# Patient Record
Sex: Female | Born: 2000 | Race: Black or African American | Hispanic: No | Marital: Single | Smoking: Never smoker
Health system: Southern US, Community
[De-identification: ages and names within clinical notes are randomized; demographics above are authoritative.]

## PROBLEM LIST (undated history)

## (undated) DIAGNOSIS — R011 Cardiac murmur, unspecified: Secondary | ICD-10-CM

## (undated) HISTORY — DX: Cardiac murmur, unspecified: R01.1

## (undated) HISTORY — PX: KNEE SURGERY: SHX244

---
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
## 2020-03-01 IMAGING — DX DG KNEE COMPLETE 4+V*L*
4 series · 4 of 4 positions shown · non-contrast
Comparison: None.

CLINICAL DATA: Knee injury playing basketball today. Left knee pain
and swelling. Initial encounter.

EXAM:
LEFT KNEE - COMPLETE 4+ VIEW

[knee ap (1 of 3)]
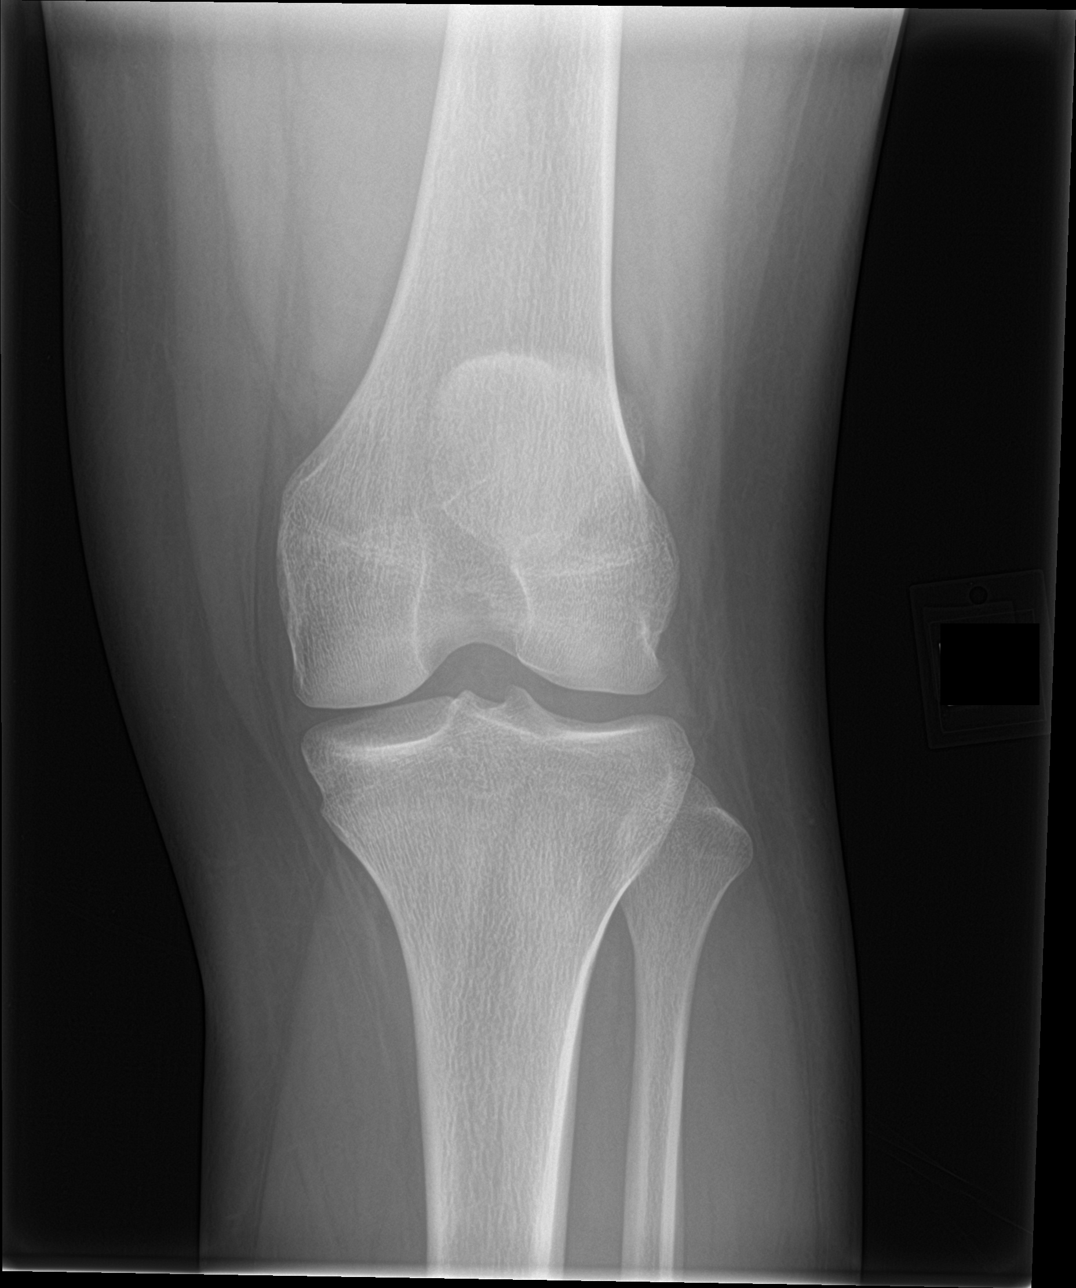

[knee lat]
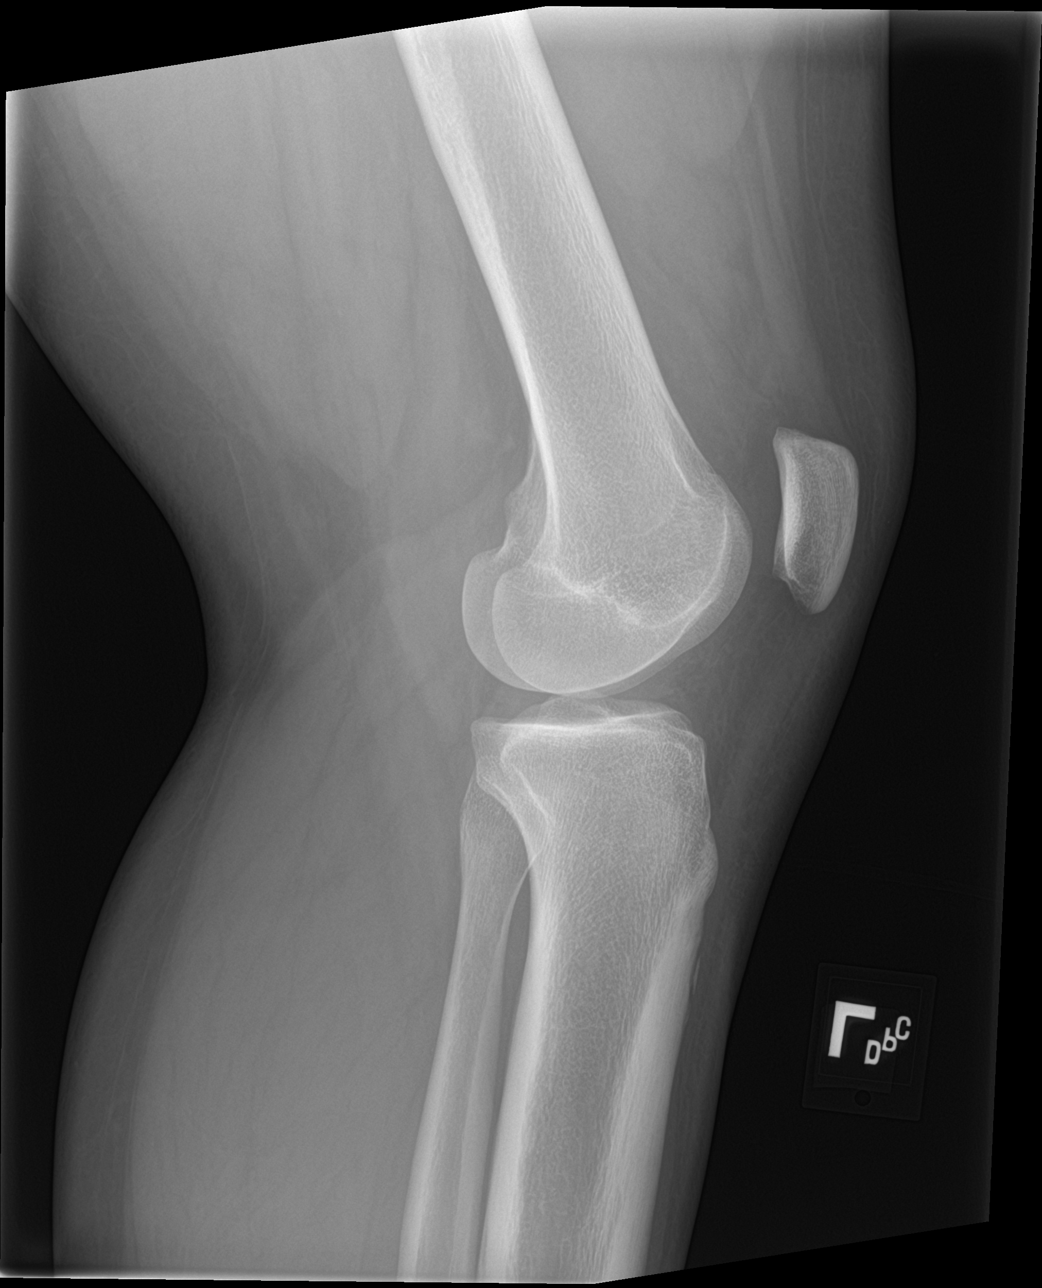

[knee ap (2 of 3)]
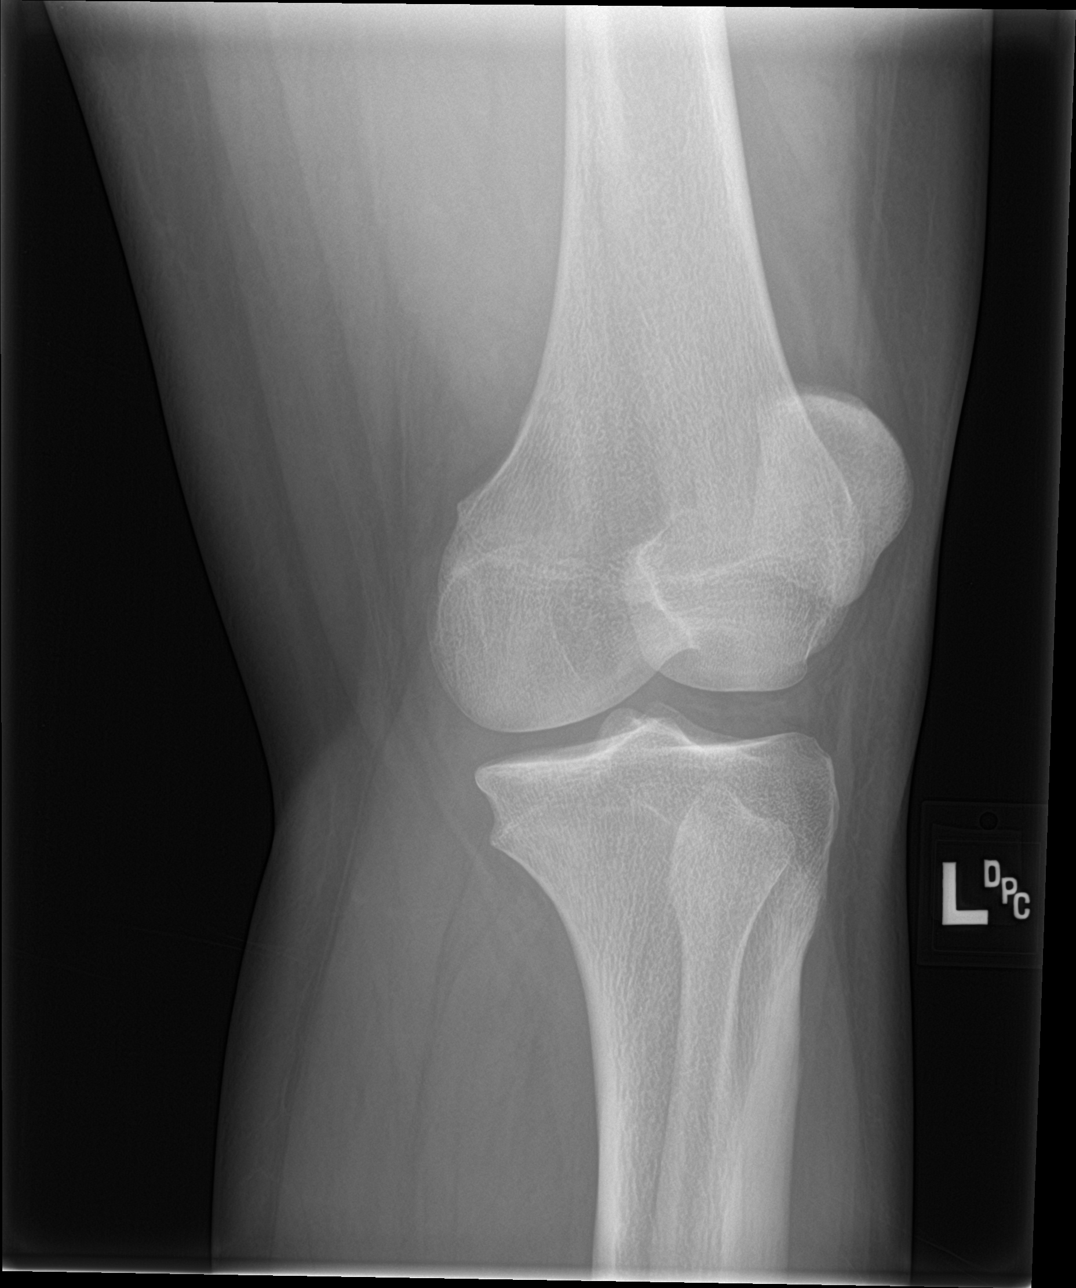

[knee ap (3 of 3)]
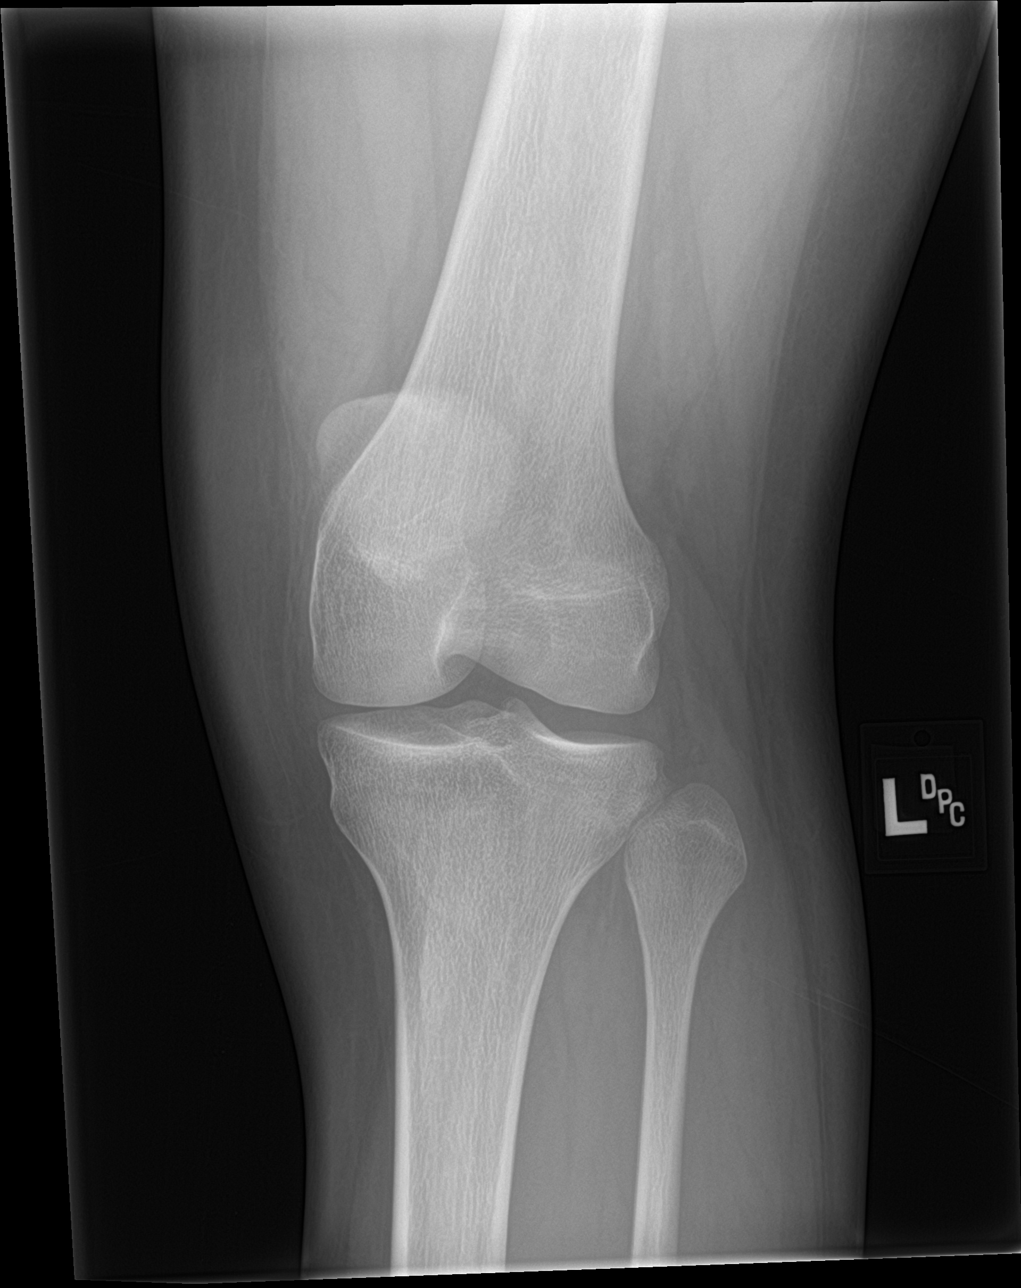

[4 of 4 positions shown; findings below may reference images not displayed]

FINDINGS: No evidence of fracture, dislocation, or joint effusion. No evidence
of arthropathy or other focal bone abnormality. Soft tissues are
unremarkable.
IMPRESSION: Negative.

## 2020-05-23 DIAGNOSIS — R2689 Other abnormalities of gait and mobility: Secondary | ICD-10-CM | POA: Insufficient documentation

## 2020-09-07 DIAGNOSIS — S8332XA Tear of articular cartilage of left knee, current, initial encounter: Secondary | ICD-10-CM | POA: Insufficient documentation

## 2022-10-06 ENCOUNTER — Inpatient Hospital Stay: Admit: 2022-10-06 | Discharge: 2022-10-06 | Payer: BLUE CROSS/BLUE SHIELD | Attending: Emergency Medicine

## 2022-10-06 ENCOUNTER — Emergency Department: Admit: 2022-10-06 | Payer: BLUE CROSS/BLUE SHIELD

## 2022-10-06 DIAGNOSIS — S060XAA Concussion with loss of consciousness status unknown, initial encounter: Principal | ICD-10-CM

## 2022-10-06 DIAGNOSIS — S0990XA Unspecified injury of head, initial encounter: Secondary | ICD-10-CM

## 2022-10-06 MED ORDER — ONDANSETRON 4 MG DISINTEGRATING TABLET
4 mg | Freq: Once | ORAL | Status: CP
Start: 2022-10-06 — End: ?
  Administered 2022-10-06: 09:00:00 4 mg via ORAL

## 2022-10-06 MED ORDER — ACETAMINOPHEN 325 MG TABLET
325 mg | Freq: Once | ORAL | Status: CP
Start: 2022-10-06 — End: ?
  Administered 2022-10-06: 09:00:00 325 mg via ORAL
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
## 2022-10-24 ENCOUNTER — Emergency Department (HOSPITAL_COMMUNITY)
Admission: EM | Admit: 2022-10-24 | Discharge: 2022-10-24 | Disposition: A | Discharge status: Home / Self Care | Payer: BLUE CROSS/BLUE SHIELD | Attending: Emergency Medicine | Admitting: Emergency Medicine

## 2022-10-24 ENCOUNTER — Other Ambulatory Visit: Payer: Self-pay

## 2022-10-24 ENCOUNTER — Encounter (HOSPITAL_COMMUNITY): Payer: Self-pay | Admitting: Emergency Medicine

## 2022-10-24 DIAGNOSIS — R011 Cardiac murmur, unspecified: Secondary | ICD-10-CM | POA: Diagnosis not present

## 2022-10-24 DIAGNOSIS — U071 COVID-19: Secondary | ICD-10-CM | POA: Insufficient documentation

## 2022-10-24 DIAGNOSIS — R509 Fever, unspecified: Secondary | ICD-10-CM | POA: Diagnosis present

## 2022-10-24 DIAGNOSIS — R6889 Other general symptoms and signs: Secondary | ICD-10-CM

## 2022-10-24 DIAGNOSIS — R000 Tachycardia, unspecified: Secondary | ICD-10-CM | POA: Diagnosis not present

## 2022-10-24 LAB — RESP PANEL BY RT-PCR (FLU A&B, COVID) ARPGX2
Influenza A by PCR: NEGATIVE
Influenza B by PCR: NEGATIVE
SARS Coronavirus 2 by RT PCR: POSITIVE — AB

## 2022-10-24 MED ORDER — KETOROLAC TROMETHAMINE 15 MG/ML IJ SOLN
15.0000 mg | Freq: Once | INTRAMUSCULAR | Status: AC
  Administered 2022-10-24: 15 mg via INTRAMUSCULAR
  Filled 2022-10-24: qty 1

## 2022-10-24 MED ORDER — ACETAMINOPHEN 325 MG PO TABS
650.0000 mg | ORAL_TABLET | Freq: Once | ORAL | Status: AC
Start: 2022-10-24 — End: 2022-10-24
  Administered 2022-10-24: 650 mg via ORAL
  Filled 2022-10-24: qty 2

## 2022-10-24 NOTE — ED Triage Notes (Signed)
Headache and dizzy spells x 2  months. Pt c/o body aches this am. C/o nausea. Denies v/d. C/o a little sob and dizziness at this time. Gen weakness noted

## 2022-10-24 NOTE — Discharge Instructions (Signed)
You came to the emergency department today with a headache and body aches.  He also noted intermittent dizziness.  You will be able to see your COVID and flu test online.    You did have a heart murmur on physical exam.  There is information about heart murmurs on these discharge papers.  I have placed a referral to cardiology, please look out for phone call from them within the next 72 hours.  Please get established with a primary care provider as well who can follow minor illnesses and any chronic conditions.  It was a pleasure to meet you today and we hope you feel better!  Any cold and flu medications over-the-counter are safe for you to use.

## 2022-10-24 NOTE — ED Provider Notes (Signed)
[facility] EMERGENCY DEPARTMENT Provider Note   CSN: [id number] Arrival date & time: 10/24/22  1800     History  Chief Complaint  Patient presents with   Generalized Body Aches   Headache    Courtney Meadows is a 21 y.o. female presenting with generalized body aches since this morning.  Says that she also had a sore throat but this resolved with Tylenol.  No known sick contacts but endorses chills and subjective fevers at home.  Endorses nausea, no vomiting or diarrhea.   Headache      Home Medications Prior to Admission medications   Medication Sig Start Date End Date Taking? Authorizing Provider  hydrocortisone cream 1 % Apply to affected area 2 times daily 08/15/17   Graciella Freer A, PA-C  ibuprofen (ADVIL,MOTRIN) 600 MG tablet Take 1 tablet (600 mg total) by mouth every 6 (six) hours as needed. 10/27/18   Triplett, Babette Relic, PA-C      Allergies    Patient has no known allergies.    Review of Systems   Review of Systems  Neurological:  Positive for headaches.    Physical Exam Updated Vital Signs BP 136/83 (BP Location: Right Arm)   Pulse (!) 108   Temp (!) 102.3 F (39.1 C) (Oral)   Resp (!) 22   LMP 10/20/2022   SpO2 100%  Physical Exam Vitals and nursing note reviewed.  Constitutional:      General: She is not in acute distress.    Appearance: Normal appearance. She is not ill-appearing.  HENT:     Head: Normocephalic and atraumatic.  Eyes:     General: No scleral icterus.    Conjunctiva/sclera: Conjunctivae normal.  Cardiovascular:     Rate and Rhythm: Regular rhythm. Tachycardia present.     Heart sounds: Murmur (Systolic heart murmur) heard.  Pulmonary:     Effort: Pulmonary effort is normal. No respiratory distress.  Musculoskeletal:     Cervical back: Normal range of motion.  Skin:    General: Skin is warm and dry.     Findings: No rash.  Neurological:     Mental Status: She is alert.  Psychiatric:        Mood and Affect: Mood normal.      ED Results / Procedures / Treatments   Labs (all labs ordered are listed, but only abnormal results are displayed) Labs Reviewed  RESP PANEL BY RT-PCR (FLU A&B, COVID) ARPGX2    EKG None  Radiology No results found.  Procedures Procedures   Medications Ordered in ED Medications  acetaminophen (TYLENOL) tablet 650 mg (650 mg Oral Given 10/24/22 1825)  ketorolac (TORADOL) 15 MG/ML injection 15 mg (15 mg Intramuscular Given 10/24/22 1855)    ED Course/ Medical Decision Making/ A&P                           Medical Decision Making Risk OTC drugs. Prescription drug management.   21 year old female presenting with body aches and headaches that started this morning.  Also reporting some chills.  Differential includes but is not limited to viral illness, meningitis, sepsis and pericarditis.  This is not exhaustive.  Treatment: Given Toradol.  On reevaluation patient endorses feeling better.  MDM/disposition: 21 year old female presenting with viral symptoms.  Was treated with Toradol with some improvement.  She was found to have a systolic heart murmur which she and her mother deny history of.  Patient does report that somebody told her she  had a "athlete's heart" at 1 point but has never been fully evaluated.  Also does not have a PCP.  I will place referrals to both cardiology and give offices for PCPs.  EKG ordered and this within normal limits.  She is ambulatory and does not appear to have an emergent condition requiring further evaluation today.  Discharged to follow-up on COVID and flu testing online.   Final Clinical Impression(s) / ED Diagnoses Final diagnoses:  Heart murmur on physical examination  Flu-like symptoms    Rx / DC Orders ED Discharge Orders          Ordered    Ambulatory referral to Cardiology       Comments: If you have not heard from the Cardiology office within the next 72 hours please call [phone].   10/24/22 1853            Results and diagnoses were explained to the patient. Return precautions discussed in full. Patient had no additional questions and expressed complete understanding.   This chart was dictated using voice recognition software.  Despite best efforts to proofread,  errors can occur which can change the documentation meaning.    Woodroe Chen 10/24/22 1945    Jacalyn Lefevre, MD 10/25/22 [phone]

## 2023-01-28 ENCOUNTER — Encounter: Payer: Self-pay | Admitting: Cardiovascular Disease

## 2023-01-28 ENCOUNTER — Ambulatory Visit: Payer: BLUE CROSS/BLUE SHIELD | Attending: Cardiovascular Disease | Admitting: Cardiovascular Disease

## 2023-01-28 VITALS — BP 120/80 | HR 66 | Ht 69.0 in | Wt 190.4 lb

## 2023-01-28 DIAGNOSIS — R011 Cardiac murmur, unspecified: Secondary | ICD-10-CM

## 2023-01-28 NOTE — Progress Notes (Signed)
Cardiology Office Note   Date:  01/28/2023   ID:  Courtney Meadows, DOB 05/14/2001, MRN [id number]  PCP:  [facility]  Cardiologist:   Kathlyn Sacramento, MD   Chief Complaint  Patient presents with   Other    Heart murmur c/o irregular heart rhythm and dizziness. Meds reviewed verbally with pt.      History of Present Illness: Courtney Meadows is a 22 y.o. female who was referred by Dr. Gilford Raid from [facility] ED for evaluation of a heart murmur.  The patient has no previous cardiac history.  She is a lifelong non-smoker and has no family history of coronary artery disease.  She does not have any chronic medical conditions. She went to the emergency room in November with body aches, GI symptoms and fever.  She was diagnosed with COVID.  She was mildly tachycardic.  Heart murmur was detected by physical exam and thus she was referred for evaluation.  She feels completely fine with no chest pain, shortness of breath or palpitations.  She has occasional dizziness but no syncope or presyncope. She used to play basketball in school with no symptoms whatsoever.   Past Medical History:  Diagnosis Date   Heart murmur     Past Surgical History:  Procedure Laterality Date   KNEE SURGERY Left      Current Outpatient Medications  Medication Sig Dispense Refill   hydrocortisone cream 1 % Apply to affected area 2 times daily 15 g 0   ibuprofen (ADVIL,MOTRIN) 600 MG tablet Take 1 tablet (600 mg total) by mouth every 6 (six) hours as needed. 30 tablet 0   No current facility-administered medications for this visit.    Allergies:   Patient has no known allergies.    Social History:  The patient  reports that she has never smoked. She has never used smokeless tobacco. She reports that she does not drink alcohol and does not use drugs.   Family History:  The patient's family history includes Heart attack in her father.    ROS:  Please see the history of  present illness.   Otherwise, review of systems are negative.   All other systems are reviewed and negative.    PHYSICAL EXAM: VS:  BP 120/80 (BP Location: Right Arm, Patient Position: Sitting, Cuff Size: Normal)   Pulse 66   Ht 5' 9"$  (1.753 m)   Wt 190 lb 6 oz (86.4 kg)   SpO2 95%   BMI 28.11 kg/m  , BMI Body mass index is 28.11 kg/m. GEN: Well nourished, well developed, in no acute distress  HEENT: normal  Neck: no JVD, carotid bruits, or masses Cardiac: RRR; no murmurs, rubs, or gallops,no edema  Respiratory:  clear to auscultation bilaterally, normal work of breathing GI: soft, nontender, nondistended, + BS MS: no deformity or atrophy  Skin: warm and dry, no rash Neuro:  Strength and sensation are intact Psych: euthymic mood, full affect   EKG:  EKG is ordered today. The ekg ordered today demonstrates normal sinus rhythm with nonspecific T wave changes.  No evidence of prior infarct.   Recent Labs: No results found for requested labs within last 365 days.    Lipid Panel No results found for: "CHOL", "TRIG", "HDL", "CHOLHDL", "VLDL", "LDLCALC", "LDLDIRECT"    Wt Readings from Last 3 Encounters:  01/28/23 190 lb 6 oz (86.4 kg)  10/24/22 190 lb (86.2 kg)  10/27/18 200 lb (90.7 kg) (98 %, Z= 2.02)*   *  Growth percentiles are based on CDC (Girls, 2-20 Years) data.          01/28/2023    2:17 PM  PAD Screen  Previous PAD dx? No  Previous surgical procedure? Yes  Dates of procedures Hx left knee surgery  Pain with walking? No  Feet/toe relief with dangling? No  Painful, non-healing ulcers? No  Extremities discolored? No      ASSESSMENT AND PLAN:  1.  Functional heart murmur: Heart murmur was detected in November in the setting of COVID infection and mild tachycardia.  I do not appreciate any cardiac murmur by physical exam today and his EKG is unremarkable.  Thus, he does not require further cardiac evaluation.    Disposition:   Follow-up as  needed  Signed,  Kathlyn Sacramento, MD  01/28/2023 2:34 PM    [place]

## 2023-01-28 NOTE — Patient Instructions (Signed)
Medication Instructions:  No changes *If you need a refill on your cardiac medications before your next appointment, please call your pharmacy*   Lab Work: None ordered If you have labs (blood work) drawn today and your tests are completely normal, you will receive your results only by: [place] (if you have MyChart) OR A paper copy in the mail If you have any lab test that is abnormal or we need to change your treatment, we will call you to review the results.   Testing/Procedures: None ordered   Follow-Up: At [facility], you and your health needs are our priority.  As part of our continuing mission to provide you with exceptional heart care, we have created designated Provider Care Teams.  These Care Teams include your primary Cardiologist (physician) and Advanced Practice Providers (APPs -  Physician Assistants and Nurse Practitioners) who all work together to provide you with the care you need, when you need it.  We recommend signing up for the patient portal called "MyChart".  Sign up information is provided on this After Visit Summary.  MyChart is used to connect with patients for Virtual Visits (Telemedicine).  Patients are able to view lab/test results, encounter notes, upcoming appointments, etc.  Non-urgent messages can be sent to your provider as well.   To learn more about what you can do with MyChart, go to [email or web address].    Your next appointment:   Follow up as needed

## 2024-02-25 ENCOUNTER — Ambulatory Visit
Admission: EM | Admit: 2024-02-25 | Discharge: 2024-02-25 | Disposition: A | Discharge status: Home / Self Care | Attending: Physician Assistant | Admitting: Physician Assistant

## 2024-02-25 ENCOUNTER — Encounter: Payer: Self-pay | Admitting: Physician Assistant

## 2024-02-25 ENCOUNTER — Ambulatory Visit (INDEPENDENT_AMBULATORY_CARE_PROVIDER_SITE_OTHER)

## 2024-02-25 DIAGNOSIS — B349 Viral infection, unspecified: Secondary | ICD-10-CM

## 2024-02-25 DIAGNOSIS — E663 Overweight: Secondary | ICD-10-CM | POA: Insufficient documentation

## 2024-02-25 DIAGNOSIS — R5383 Other fatigue: Secondary | ICD-10-CM

## 2024-02-25 DIAGNOSIS — S8392XA Sprain of unspecified site of left knee, initial encounter: Secondary | ICD-10-CM | POA: Insufficient documentation

## 2024-02-25 DIAGNOSIS — R051 Acute cough: Secondary | ICD-10-CM

## 2024-02-25 DIAGNOSIS — S8261XA Displaced fracture of lateral malleolus of right fibula, initial encounter for closed fracture: Secondary | ICD-10-CM | POA: Insufficient documentation

## 2024-02-25 DIAGNOSIS — Z00129 Encounter for routine child health examination without abnormal findings: Secondary | ICD-10-CM | POA: Insufficient documentation

## 2024-02-25 DIAGNOSIS — Z68.41 Body mass index (BMI) pediatric, greater than or equal to 95th percentile for age: Secondary | ICD-10-CM | POA: Insufficient documentation

## 2024-02-25 DIAGNOSIS — R0602 Shortness of breath: Secondary | ICD-10-CM

## 2024-02-25 MED ORDER — PROMETHAZINE-DM 6.25-15 MG/5ML PO SYRP: 5.0000 mL | ORAL_SOLUTION | Freq: Four times a day (QID) | ORAL | 0 refills | Status: DC | PRN

## 2024-02-25 NOTE — ED Provider Notes (Signed)
 [facility]    CSN: [id number] Arrival date & time: 02/25/24  1300      History   Chief Complaint Chief Complaint  Patient presents with   Cough   Nasal Congestion   Wheezing    HPI Courtney Meadows is a 23 y.o. female presenting for 1 week history of fatigue, cough, congestion, sore throat, bilateral rib pain, and wheezing. Denies fever, ear pain, sinus pain, chest pain,  abdominal pain, vomiting or diarrhea.   Patient has been taking over-the-counter meds. No other complaints.  HPI  Past Medical History:  Diagnosis Date   Heart murmur     Patient Active Problem List   Diagnosis Date Noted   Body mass index (BMI) pediatric, 95th percentile for age to less than 120% of the 95th percentile for age 71/19/2025   Closed avulsion fracture of lateral malleolus of right fibula 02/25/2024   Overweight 02/25/2024   Sprain of left knee 02/25/2024   Well adolescent visit 02/25/2024   Tear of articular cartilage of left knee as current injury 09/07/2020   Antalgic gait 05/23/2020    Past Surgical History:  Procedure Laterality Date   KNEE SURGERY Left     OB History   No obstetric history on file.      Home Medications    Prior to Admission medications   Medication Sig Start Date End Date Taking? Authorizing Provider  promethazine-dextromethorphan (PROMETHAZINE-DM) 6.25-15 MG/5ML syrup Take 5 mLs by mouth 4 (four) times daily as needed. 02/25/24  Yes Eusebio Friendly B, PA-C  hydrocortisone cream 1 % Apply to affected area 2 times daily 08/15/17   Graciella Freer A, PA-C  ibuprofen (ADVIL,MOTRIN) 600 MG tablet Take 1 tablet (600 mg total) by mouth every 6 (six) hours as needed. 10/27/18   Pauline Aus, PA-C    Family History Family History  Problem Relation Age of Onset   Heart attack Father     Social History Social History   Tobacco Use   Smoking status: Never   Smokeless tobacco: Never  Vaping Use   Vaping status: Never Used  Substance Use Topics    Alcohol use: No   Drug use: No     Allergies   Patient has no known allergies.   Review of Systems Review of Systems  Constitutional:  Positive for fatigue. Negative for chills, diaphoresis and fever.  HENT:  Positive for congestion and rhinorrhea. Negative for ear pain, sinus pressure, sinus pain and sore throat.   Respiratory:  Positive for cough, shortness of breath and wheezing.   Cardiovascular:  Negative for chest pain.  Gastrointestinal:  Negative for abdominal pain, nausea and vomiting.  Musculoskeletal:  Negative for arthralgias and myalgias.  Skin:  Negative for rash.  Neurological:  Negative for weakness and headaches.  Hematological:  Negative for adenopathy.     Physical Exam Triage Vital Signs ED Triage Vitals [02/25/24 1309]  Encounter Vitals Group     BP      Systolic BP Percentile      Diastolic BP Percentile      Pulse      Resp 16     Temp      Temp Source Oral     SpO2      Weight      Height      Head Circumference      Peak Flow      Pain Score      Pain Loc      Pain Education  Exclude from Growth Chart    No data found.  Updated Vital Signs BP 124/78 (BP Location: Left Arm)   Pulse 76   Temp 98.8 F (37.1 C) (Oral)   Resp 16   Ht 5\' 9"  (1.753 m)   Wt 190 lb (86.2 kg)   LMP 02/04/2024 (Approximate)   SpO2 98%   BMI 28.06 kg/m      Physical Exam Vitals and nursing note reviewed.  Constitutional:      General: She is not in acute distress.    Appearance: Normal appearance. She is not ill-appearing or toxic-appearing.  HENT:     Head: Normocephalic and atraumatic.     Nose: Congestion present.     Mouth/Throat:     Mouth: Mucous membranes are moist.     Pharynx: Oropharynx is clear.  Eyes:     General: No scleral icterus.       Right eye: No discharge.        Left eye: No discharge.     Conjunctiva/sclera: Conjunctivae normal.  Cardiovascular:     Rate and Rhythm: Normal rate and regular rhythm.     Heart sounds:  Normal heart sounds.  Pulmonary:     Effort: Pulmonary effort is normal. No respiratory distress.     Breath sounds: Normal breath sounds.  Musculoskeletal:     Cervical back: Neck supple.  Skin:    General: Skin is dry.  Neurological:     General: No focal deficit present.     Mental Status: She is alert. Mental status is at baseline.     Motor: No weakness.     Gait: Gait normal.  Psychiatric:        Mood and Affect: Mood normal.        Behavior: Behavior normal.      UC Treatments / Results  Labs (all labs ordered are listed, but only abnormal results are displayed) Labs Reviewed - No data to display  EKG   Radiology DG Chest 2 View Result Date: 02/25/2024 CLINICAL DATA:  Cough, wheezing for 1 week. EXAM: CHEST - 2 VIEW COMPARISON:  None Available. FINDINGS: The heart size and mediastinal contours are within normal limits. Both lungs are clear. The visualized skeletal structures are unremarkable. IMPRESSION: No active cardiopulmonary disease. Electronically Signed   By: Lupita Raider M.D.   On: 02/25/2024 15:20    Procedures Procedures (including critical care time)  Medications Ordered in UC Medications - No data to display  Initial Impression / Assessment and Plan / UC Course  I have reviewed the triage vital signs and the nursing notes.  Pertinent labs & imaging results that were available during my care of the patient were reviewed by me and considered in my medical decision making (see chart for details).   23 year old female presents with mother for cough, congestion, fatigue, wheezing x 1 week.  Vitals are stable and normal.  Overall well-appearing.  No acute distress.  On exam has nasal congestion.  Throat clear.  Chest clear.  Heart regular rate and rhythm.  Chest x-ray performed given complaints of wheezing, shortness of breath and rib pain.  Wet read x-ray negative.  Reviewed with patient.  Viral illness.  Supportive care encouraged.  Increase rest  and fluids.  Explained symptoms of bronchitis can last up to 6 weeks.  Sent Promethazine DM to pharmacy.  Advised I will contact her if the radiologist sees something abnormal on imaging which requires a different treatment plan.  Patient's  mother is concerned about potential anemia.  I offered to check a CBC but encouraged her to make an appointment with a primary care provider for physical exam and other labs.  Patient declines getting CBC checked today.  Explained that she may go onto the [facility] health website to make an appointment with primary care provider.  Chest x-ray negative.    Final Clinical Impressions(s) / UC Diagnoses   Final diagnoses:  Acute cough  Viral illness  Shortness of breath  Other fatigue     Discharge Instructions      -I did not see any abnormality on the x-ray but I will contact you with radiologist does. - Symptoms consistent with viral bronchitis/chest cold.  Symptoms can last up to 6 weeks.  I expect to get better over the next 1 to 2 weeks.  I sent cough medicine.  Increase rest and fluids. - You should be seen again if you develop fever, increased chest/rib pain or increased breathing problem. - I encourage you to go onto the [facility] health website and make an appointment with primary care provider for general physical and lab work.     ED Prescriptions     Medication Sig Dispense Auth. Provider   promethazine-dextromethorphan (PROMETHAZINE-DM) 6.25-15 MG/5ML syrup Take 5 mLs by mouth 4 (four) times daily as needed. 118 mL Shirlee Latch, PA-C      PDMP not reviewed this encounter.   Shirlee Latch, PA-C 02/25/24 1537

## 2024-02-25 NOTE — ED Triage Notes (Signed)
 Pt c/o cough,wheezing & congestion x1 wk. Denies any hx of asthma or COPD.

## 2024-02-25 NOTE — Discharge Instructions (Addendum)
-  I did not see any abnormality on the x-ray but I will contact you with radiologist does. - Symptoms consistent with viral bronchitis/chest cold.  Symptoms can last up to 6 weeks.  I expect to get better over the next 1 to 2 weeks.  I sent cough medicine.  Increase rest and fluids. - You should be seen again if you develop fever, increased chest/rib pain or increased breathing problem. - I encourage you to go onto the [facility] health website and make an appointment with primary care provider for general physical and lab work.

## 2024-07-02 ENCOUNTER — Emergency Department (HOSPITAL_COMMUNITY)

## 2024-07-02 ENCOUNTER — Other Ambulatory Visit: Payer: Self-pay

## 2024-07-02 ENCOUNTER — Encounter (HOSPITAL_COMMUNITY): Payer: Self-pay | Admitting: Emergency Medicine

## 2024-07-02 ENCOUNTER — Inpatient Hospital Stay (HOSPITAL_COMMUNITY)
Admission: EM | Admit: 2024-07-02 | Discharge: 2024-07-06 | DRG: 758 | Disposition: A | Discharge status: Home / Self Care | Attending: Obstetrics and Gynecology | Admitting: Obstetrics and Gynecology

## 2024-07-02 DIAGNOSIS — D509 Iron deficiency anemia, unspecified: Secondary | ICD-10-CM | POA: Diagnosis present

## 2024-07-02 DIAGNOSIS — N7093 Salpingitis and oophoritis, unspecified: Principal | ICD-10-CM | POA: Diagnosis present

## 2024-07-02 DIAGNOSIS — D649 Anemia, unspecified: Secondary | ICD-10-CM

## 2024-07-02 DIAGNOSIS — Z8249 Family history of ischemic heart disease and other diseases of the circulatory system: Secondary | ICD-10-CM

## 2024-07-02 DIAGNOSIS — A5424 Gonococcal female pelvic inflammatory disease: Secondary | ICD-10-CM | POA: Diagnosis present

## 2024-07-02 DIAGNOSIS — R109 Unspecified abdominal pain: Secondary | ICD-10-CM | POA: Diagnosis present

## 2024-07-02 DIAGNOSIS — N83201 Unspecified ovarian cyst, right side: Secondary | ICD-10-CM | POA: Diagnosis present

## 2024-07-02 LAB — CBC WITH DIFFERENTIAL/PLATELET
Abs Immature Granulocytes: 0.08 K/uL — ABNORMAL HIGH (ref 0.00–0.07)
Basophils Absolute: 0 K/uL (ref 0.0–0.1)
Basophils Relative: 0 %
Eosinophils Absolute: 0 K/uL (ref 0.0–0.5)
Eosinophils Relative: 0 %
HCT: 19.1 % — ABNORMAL LOW (ref 36.0–46.0)
Hemoglobin: 5 g/dL — CL (ref 12.0–15.0)
Immature Granulocytes: 1 %
Lymphocytes Relative: 12 %
Lymphs Abs: 1.4 K/uL (ref 0.7–4.0)
MCH: 17.4 pg — ABNORMAL LOW (ref 26.0–34.0)
MCHC: 26.2 g/dL — ABNORMAL LOW (ref 30.0–36.0)
MCV: 66.6 fL — ABNORMAL LOW (ref 80.0–100.0)
Monocytes Absolute: 1.3 K/uL — ABNORMAL HIGH (ref 0.1–1.0)
Monocytes Relative: 11 %
Neutro Abs: 8.9 K/uL — ABNORMAL HIGH (ref 1.7–7.7)
Neutrophils Relative %: 76 %
Platelets: 846 K/uL — ABNORMAL HIGH (ref 150–400)
RBC: 2.87 MIL/uL — ABNORMAL LOW (ref 3.87–5.11)
RDW: 30.1 % — ABNORMAL HIGH (ref 11.5–15.5)
WBC: 11.7 K/uL — ABNORMAL HIGH (ref 4.0–10.5)
nRBC: 0 % (ref 0.0–0.2)

## 2024-07-02 LAB — PREGNANCY, URINE: Preg Test, Ur: NEGATIVE

## 2024-07-02 LAB — PREPARE RBC (CROSSMATCH)

## 2024-07-02 LAB — COMPREHENSIVE METABOLIC PANEL WITH GFR
ALT: 9 U/L (ref 0–44)
AST: 19 U/L (ref 15–41)
Albumin: 3.2 g/dL — ABNORMAL LOW (ref 3.5–5.0)
Alkaline Phosphatase: 40 U/L (ref 38–126)
Anion gap: 14 (ref 5–15)
BUN: 5 mg/dL — ABNORMAL LOW (ref 6–20)
CO2: 24 mmol/L (ref 22–32)
Calcium: 8.8 mg/dL — ABNORMAL LOW (ref 8.9–10.3)
Chloride: 96 mmol/L — ABNORMAL LOW (ref 98–111)
Creatinine, Ser: 0.69 mg/dL (ref 0.44–1.00)
GFR, Estimated: >60 mL/min (ref >60)
Glucose, Bld: 96 mg/dL (ref 70–99)
Potassium: 3.2 mmol/L — ABNORMAL LOW (ref 3.5–5.1)
Sodium: 134 mmol/L — ABNORMAL LOW (ref 135–145)
Total Bilirubin: 0.5 mg/dL (ref 0.0–1.2)
Total Protein: 8.3 g/dL — ABNORMAL HIGH (ref 6.5–8.1)

## 2024-07-02 LAB — URINALYSIS, W/ REFLEX TO CULTURE (INFECTION SUSPECTED)
Bilirubin Urine: NEGATIVE
Glucose, UA: NEGATIVE mg/dL
Ketones, ur: NEGATIVE mg/dL
Nitrite: NEGATIVE
Protein, ur: 30 mg/dL — AB
RBC / HPF: >50 RBC/hpf (ref 0–5)
Specific Gravity, Urine: 1.01 (ref 1.005–1.030)
pH: 7 (ref 5.0–8.0)

## 2024-07-02 LAB — ABO/RH: ABO/RH(D): O POS

## 2024-07-02 LAB — IRON AND TIBC
Iron: 11 ug/dL — ABNORMAL LOW (ref 28–170)
Saturation Ratios: 3 % — ABNORMAL LOW (ref 10.4–31.8)
TIBC: 365 ug/dL (ref 250–450)
UIBC: 354 ug/dL

## 2024-07-02 MED ORDER — METRONIDAZOLE 500 MG/100ML IV SOLN
500.0000 mg | Freq: Once | INTRAVENOUS | Status: AC
  Administered 2024-07-02: 500 mg via INTRAVENOUS
  Filled 2024-07-02: qty 100

## 2024-07-02 MED ORDER — SODIUM CHLORIDE 0.9% IV SOLUTION: Freq: Once | INTRAVENOUS | Status: AC

## 2024-07-02 MED ORDER — SODIUM CHLORIDE 0.9 % IV SOLN
2.0000 g | Freq: Four times a day (QID) | INTRAVENOUS | Status: DC
  Filled 2024-07-02 (×3): qty 2

## 2024-07-02 MED ORDER — DIPHENHYDRAMINE HCL 25 MG PO CAPS
25.0000 mg | ORAL_CAPSULE | Freq: Once | ORAL | Status: AC
  Administered 2024-07-03: 25 mg via ORAL
  Filled 2024-07-02: qty 1

## 2024-07-02 MED ORDER — MENTHOL 3 MG MT LOZG: 1.0000 | LOZENGE | OROMUCOSAL | Status: DC | PRN

## 2024-07-02 MED ORDER — SODIUM CHLORIDE 0.9% IV SOLUTION: Freq: Once | INTRAVENOUS | Status: DC

## 2024-07-02 MED ORDER — IBUPROFEN 600 MG PO TABS
600.0000 mg | ORAL_TABLET | Freq: Four times a day (QID) | ORAL | Status: DC | PRN
  Administered 2024-07-03 – 2024-07-05 (×4): 600 mg via ORAL
  Filled 2024-07-02 (×4): qty 1

## 2024-07-02 MED ORDER — SODIUM CHLORIDE 0.9 % IV SOLN
100.0000 mg | Freq: Two times a day (BID) | INTRAVENOUS | Status: DC
  Administered 2024-07-03 – 2024-07-06 (×7): 100 mg via INTRAVENOUS
  Filled 2024-07-02 (×8): qty 100

## 2024-07-02 MED ORDER — HYDROMORPHONE HCL 1 MG/ML IJ SOLN
0.5000 mg | Freq: Once | INTRAMUSCULAR | Status: AC
  Administered 2024-07-02: 0.5 mg via INTRAVENOUS
  Filled 2024-07-02: qty 0.5

## 2024-07-02 MED ORDER — HYDROMORPHONE HCL 1 MG/ML IJ SOLN: 0.2000 mg | INTRAMUSCULAR | Status: DC | PRN

## 2024-07-02 MED ORDER — ONDANSETRON HCL 4 MG PO TABS
4.0000 mg | ORAL_TABLET | Freq: Four times a day (QID) | ORAL | Status: DC | PRN
  Administered 2024-07-02: 4 mg via ORAL
  Filled 2024-07-02: qty 1

## 2024-07-02 MED ORDER — LACTATED RINGERS IV BOLUS
1000.0000 mL | Freq: Once | INTRAVENOUS | Status: AC
  Administered 2024-07-02: 1000 mL via INTRAVENOUS

## 2024-07-02 MED ORDER — FUROSEMIDE 10 MG/ML IJ SOLN
20.0000 mg | Freq: Once | INTRAMUSCULAR | Status: AC
  Administered 2024-07-03: 20 mg via INTRAVENOUS
  Filled 2024-07-02: qty 2

## 2024-07-02 MED ORDER — SODIUM CHLORIDE 0.9 % IV SOLN
2.0000 g | Freq: Four times a day (QID) | INTRAVENOUS | Status: DC
  Filled 2024-07-02 (×2): qty 2

## 2024-07-02 MED ORDER — METRONIDAZOLE 500 MG/100ML IV SOLN
500.0000 mg | Freq: Two times a day (BID) | INTRAVENOUS | Status: DC
  Administered 2024-07-03 – 2024-07-06 (×7): 500 mg via INTRAVENOUS
  Filled 2024-07-02 (×7): qty 100

## 2024-07-02 MED ORDER — ONDANSETRON HCL 4 MG/2ML IJ SOLN: 4.0000 mg | Freq: Four times a day (QID) | INTRAMUSCULAR | Status: DC | PRN

## 2024-07-02 MED ORDER — DOXYCYCLINE HYCLATE 100 MG PO TABS
100.0000 mg | ORAL_TABLET | Freq: Once | ORAL | Status: AC
  Administered 2024-07-02: 100 mg via ORAL
  Filled 2024-07-02: qty 1

## 2024-07-02 MED ORDER — OXYCODONE-ACETAMINOPHEN 5-325 MG PO TABS
1.0000 | ORAL_TABLET | ORAL | Status: DC | PRN
  Administered 2024-07-02 – 2024-07-03 (×3): 2 via ORAL
  Filled 2024-07-02 (×3): qty 2

## 2024-07-02 MED ORDER — KETOROLAC TROMETHAMINE 15 MG/ML IJ SOLN
15.0000 mg | Freq: Once | INTRAMUSCULAR | Status: AC
  Administered 2024-07-02: 15 mg via INTRAVENOUS
  Filled 2024-07-02: qty 1

## 2024-07-02 MED ORDER — ALUM & MAG HYDROXIDE-SIMETH 200-200-20 MG/5ML PO SUSP: 30.0000 mL | ORAL | Status: DC | PRN

## 2024-07-02 MED ORDER — GUAIFENESIN 100 MG/5ML PO LIQD: 15.0000 mL | ORAL | Status: DC | PRN

## 2024-07-02 MED ORDER — IOHEXOL 300 MG/ML  SOLN
100.0000 mL | Freq: Once | INTRAMUSCULAR | Status: AC | PRN
  Administered 2024-07-02: 100 mL via INTRAVENOUS

## 2024-07-02 MED ORDER — POLYETHYLENE GLYCOL 3350 17 G PO PACK: 17.0000 g | PACK | Freq: Every day | ORAL | Status: DC | PRN

## 2024-07-02 MED ORDER — SODIUM CHLORIDE 0.9 % IV SOLN
2.0000 g | Freq: Once | INTRAVENOUS | Status: AC
  Administered 2024-07-02: 2 g via INTRAVENOUS
  Filled 2024-07-02: qty 20

## 2024-07-02 MED ORDER — ACETAMINOPHEN 325 MG PO TABS
650.0000 mg | ORAL_TABLET | Freq: Once | ORAL | Status: AC
  Administered 2024-07-02: 650 mg via ORAL
  Filled 2024-07-02: qty 2

## 2024-07-02 MED ORDER — SODIUM CHLORIDE 0.9 % IV SOLN: INTRAVENOUS | Status: AC

## 2024-07-02 NOTE — ED Triage Notes (Signed)
 Pt c/o LLQ abd pain x 2 weeks

## 2024-07-02 NOTE — ED Notes (Signed)
 [facility] has arrived at this time for transport. Face sheet and med Nec. Was given to transport personnel.

## 2024-07-02 NOTE — H&P (Signed)
 Courtney Meadows is an 23 y.o. G0   Chief Complaint: abdominal pain HPI: Pt reports 2 weeks of abdominal pain. She reports normal cycles that are not heavy. + fevers. Worsened today. Came in. Noted to have hgb of 5 and u/s and CT showing bilateral ovarian cysts with inflammation about the left ovary.  Patient reports her pain is worse on the left.  She reports some mild nausea.  She denies significant blood loss from her GI tract.  Past Medical History:  Diagnosis Date   Heart murmur     Past Surgical History:  Procedure Laterality Date   KNEE SURGERY Left     Family History  Problem Relation Age of Onset   Heart attack Father    Social History:  reports that she has never smoked. She has never used smokeless tobacco. She reports that she does not drink alcohol and does not use drugs.  Allergies: No Known Allergies  Medications Prior to Admission  Medication Sig Dispense Refill   hydrocortisone  cream 1 % Apply to affected area 2 times daily 15 g 0   ibuprofen  (ADVIL ,MOTRIN ) 600 MG tablet Take 1 tablet (600 mg total) by mouth every 6 (six) hours as needed. 30 tablet 0   promethazine -dextromethorphan (PROMETHAZINE -DM) 6.25-15 MG/5ML syrup Take 5 mLs by mouth 4 (four) times daily as needed. 118 mL 0    A comprehensive review of systems was negative.  Blood pressure 131/79, pulse (!) 103, temperature (!) 100.6 F (38.1 C), temperature source Oral, resp. rate 18, height 5' 10 (1.778 m), weight 81.6 kg, last menstrual period 06/30/2024, SpO2 100%. General appearance: alert, cooperative, and appears stated age Head: Normocephalic, without obvious abnormality, atraumatic Neck: supple, symmetrical, trachea midline Lungs: Normal effort Heart: regular rate and rhythm Abdomen: Soft, tender to palpation left greater than rightno guarding or rebound.   Results for orders placed or performed during the hospital encounter of 07/02/24 (from the past 24 hours)  Urinalysis, w/ Reflex to Culture  (Infection Suspected) -Urine, Clean Catch     Status: Abnormal   Collection Time: 07/02/24  4:13 PM  Result Value Ref Range   Specimen Source URINE, CATHETERIZED    Color, Urine YELLOW YELLOW   APPearance CLEAR CLEAR   Specific Gravity, Urine 1.010 1.005 - 1.030   pH 7.0 5.0 - 8.0   Glucose, UA NEGATIVE NEGATIVE mg/dL   Hgb urine dipstick LARGE (A) NEGATIVE   Bilirubin Urine NEGATIVE NEGATIVE   Ketones, ur NEGATIVE NEGATIVE mg/dL   Protein, ur 30 (A) NEGATIVE mg/dL   Nitrite NEGATIVE NEGATIVE   Leukocytes,Ua MODERATE (A) NEGATIVE   RBC / HPF >50 0 - 5 RBC/hpf   WBC, UA 21-50 0 - 5 WBC/hpf   Bacteria, UA RARE (A) NONE SEEN   Squamous Epithelial / HPF 0-5 0 - 5 /HPF  Comprehensive metabolic panel     Status: Abnormal   Collection Time: 07/02/24  4:55 PM  Result Value Ref Range   Sodium 134 (L) 135 - 145 mmol/L   Potassium 3.2 (L) 3.5 - 5.1 mmol/L   Chloride 96 (L) 98 - 111 mmol/L   CO2 24 22 - 32 mmol/L   Glucose, Bld 96 70 - 99 mg/dL   BUN 5 (L) 6 - 20 mg/dL   Creatinine, Ser 9.30 0.44 - 1.00 mg/dL   Calcium 8.8 (L) 8.9 - 10.3 mg/dL   Total Protein 8.3 (H) 6.5 - 8.1 g/dL   Albumin 3.2 (L) 3.5 - 5.0 g/dL   AST 19  15 - 41 U/L   ALT 9 0 - 44 U/L   Alkaline Phosphatase 40 38 - 126 U/L   Total Bilirubin 0.5 0.0 - 1.2 mg/dL   GFR, Estimated >39 >39 mL/min   Anion gap 14 5 - 15  CBC with Differential     Status: Abnormal   Collection Time: 07/02/24  4:55 PM  Result Value Ref Range   WBC 11.7 (H) 4.0 - 10.5 K/uL   RBC 2.87 (L) 3.87 - 5.11 MIL/uL   Hemoglobin 5.0 (LL) 12.0 - 15.0 g/dL   HCT 80.8 (L) 63.9 - 53.9 %   MCV 66.6 (L) 80.0 - 100.0 fL   MCH 17.4 (L) 26.0 - 34.0 pg   MCHC 26.2 (L) 30.0 - 36.0 g/dL   RDW 69.8 (H) 88.4 - 84.4 %   Platelets 846 (H) 150 - 400 K/uL   nRBC 0.0 0.0 - 0.2 %   Neutrophils Relative % 76 %   Neutro Abs 8.9 (H) 1.7 - 7.7 K/uL   Lymphocytes Relative 12 %   Lymphs Abs 1.4 0.7 - 4.0 K/uL   Monocytes Relative 11 %   Monocytes Absolute 1.3 (H)  0.1 - 1.0 K/uL   Eosinophils Relative 0 %   Eosinophils Absolute 0.0 0.0 - 0.5 K/uL   Basophils Relative 0 %   Basophils Absolute 0.0 0.0 - 0.1 K/uL   WBC Morphology MORPHOLOGY UNREMARKABLE    RBC Morphology See Note    Smear Review MORPHOLOGY UNREMARKABLE    Immature Granulocytes 1 %   Abs Immature Granulocytes 0.08 (H) 0.00 - 0.07 K/uL   Schistocytes PRESENT    Tear Drop Cells PRESENT    Polychromasia PRESENT    Ovalocytes PRESENT   Iron and TIBC     Status: Abnormal   Collection Time: 07/02/24  4:55 PM  Result Value Ref Range   Iron 11 (L) 28 - 170 ug/dL   TIBC 634 749 - 549 ug/dL   Saturation Ratios 3 (L) 10.4 - 31.8 %   UIBC 354 ug/dL  ABO/Rh     Status: None   Collection Time: 07/02/24  4:55 PM  Result Value Ref Range   ABO/RH(D)      O POS Performed at [facility], [address], [place], [place] [address]   Type and screen [facility]     Status: None (Preliminary result)   Collection Time: 07/02/24  7:10 PM  Result Value Ref Range   ABO/RH(D) O POS    Antibody Screen NEG    Sample Expiration 07/05/2024,2359    Unit Number [id number]    Blood Component Type RED CELLS,LR    Unit division 00    Status of Unit ALLOCATED    Transfusion Status OK TO TRANSFUSE    Crossmatch Result Compatible    Unit Number [id number]    Blood Component Type RED CELLS,LR    Unit division 00    Status of Unit ISSUED    Transfusion Status OK TO TRANSFUSE    Crossmatch Result      Compatible Performed at [facility], [address], [place], [place] [address]   Prepare RBC (crossmatch)     Status: None   Collection Time: 07/02/24  7:10 PM  Result Value Ref Range   Order Confirmation      ORDER PROCESSED BY BLOOD BANK Performed at [facility], [address] [place], [place] [address]   Pregnancy, urine     Status: None   Collection Time:  07/02/24  7:14 PM  Result Value Ref Range   Preg Test, Ur NEGATIVE NEGATIVE   CT ABDOMEN PELVIS W CONTRAST Result Date:  07/02/2024 CLINICAL DATA:  Left lower quadrant abdominal pain for 2 weeks. EXAM: CT ABDOMEN AND PELVIS WITH CONTRAST TECHNIQUE: Multidetector CT imaging of the abdomen and pelvis was performed using the standard protocol following bolus administration of intravenous contrast. RADIATION DOSE REDUCTION: This exam was performed according to the departmental dose-optimization program which includes automated exposure control, adjustment of the mA and/or kV according to patient size and/or use of iterative reconstruction technique. CONTRAST:  OMNIPAQUE  IOHEXOL  300 MG/ML  SOLN COMPARISON:  Pelvic ultrasound 07/02/2024 FINDINGS: Lower chest: Unremarkable Hepatobiliary: Unremarkable Pancreas: Unremarkable Spleen: The spleen measures 11.6 by 6.2 by 12.6 cm (volume = 470 cm^3), compatible with mild splenomegaly. Adrenals/Urinary Tract: Unremarkable Stomach/Bowel: The appendix is somewhat indistinct but is believed to course just above the right ovary for example on image 69 series 5. No dilated bowel. Vascular/Lymphatic: Unremarkable Reproductive: Cystic lesion of the right ovary with internal density 19 Hounsfield units and somewhat thick margins, measuring 6.3 by 4.9 by 6.7 cm. Complex left adnexal/ovarian lesions, favoring ovarian cystic lesions although possibly from hematosalpinx or pyosalpinx, characterized on prior ultrasound. No gas is identified within these complex ovarian structures. There is inflammatory stranding especially around the left ovary/adnexa. If the patient has signs of infection then tubo-ovarian abscesses might be considered. 3.9 cm soft tissue density lesion anterior to the uterine body on image 75 series 5 is nonspecific but probably an anterior subserosal fibroid. Other: Trace free pelvic fluid. Trace edema in the left lower quadrant omentum. Musculoskeletal: Unremarkable IMPRESSION: 1. Complex left adnexal/ovarian lesions, favoring ovarian cystic lesions although possibly from hematosalpinx  or pyosalpinx, characterized on prior ultrasound. There is inflammatory stranding especially around the left ovary/adnexa. If the patient has signs of infection then tubo-ovarian abscesses might be considered. Urine pregnancy test negative hence ectopic pregnancy is not a consideration. Referring to the prior ultrasound report, teratoma/dermoid is not likely given the lack of fat density on today's exam. A hemorrhagic or collapsed cyst is likewise a differential diagnostic consideration. Correlate with the patient's clinical scenario (pelvic exam, presence of fever/leukocytosis, and similar factors) to narrow the differential diagnosis. As noted on ultrasound, MRI may prove helpful in further characterization. 2. Cystic lesion of the right ovary with internal density 19 Hounsfield units and somewhat thick margins, measuring 6.3 by 4.9 by 6.7 cm. This is probably a hemorrhagic cyst but follow-up ultrasound is recommended in 6-12 weeks to confirm resolution. 3. Mild splenomegaly. 4. 3.9 cm soft tissue density lesion anterior to the uterine body is nonspecific but probably an anterior subserosal fibroid. Electronically Signed   By: Ryan Salvage M.D.   On: 07/02/2024 20:01   US  PELVIC COMPLETE W TRANSVAGINAL AND TORSION R/O Result Date: 07/02/2024 CLINICAL DATA:  Pelvic pain EXAM: TRANSABDOMINAL AND TRANSVAGINAL ULTRASOUND OF PELVIS TECHNIQUE: Both transabdominal and transvaginal ultrasound examinations of the pelvis were performed. Transabdominal technique was performed for global imaging of the pelvis including uterus, ovaries, adnexal regions, and pelvic cul-de-sac. It was necessary to proceed with endovaginal exam following the transabdominal exam to visualize the ovaries. COMPARISON:  None Available. FINDINGS: Uterus Measurements: 7.4 x 3.5 x 3.9 cm = volume: 53.5 3 mm mL. There is an anterior lower uterine segment fibroid measuring 4 x 3 x 4.5 cm. Endometrium Thickness: 3 mm.  No focal abnormality  visualized. Right ovary Measurements: 7.5 x 5.4 x 6.8 cm =  volume: 152 mL. There is a complex cystic structure measuring 7 x 5 x 6 cm with lace-like internal solid components without significant increased vascularity Left ovary Measurements: 6.5 x 6 x 5.8 cm = volume: 117 mL. Solid cystic multiloculated mass measuring 5.9 x 5.1 x 4.4 cm with some increased vascularity Other findings Trace pelvic  free fluid. IMPRESSION: Bilateral ovarian complex cysts may represent hemorrhagic cysts including endometriosis or representing teratoma/dermoid. Other cystic ovarian neoplasia cannot be excluded. Recommend short-term follow-up with pelvic MRI to ensure resolution. Lower anterior uterine segment subserosal fibroid. Electronically Signed   By: Megan  Zare M.D.   On: 07/02/2024 17:55    Assessment/Plan Principal Problem:   TOA (tubo-ovarian abscess) Antibiotics of cefoxitin , Flagyl , Doxy Await GC, chlamydia cultures The  TOA is not a size that should require IR drainage.  Significant anemia with hemoglobin of 5.0 of unclear etiology Iron studies pending Transfusion 3 units Repeat CBC in the a.m. Initially was concerned about hemorrhagic cyst in the adnexa but there is no a lot of blood or free fluid in the abdomen on CT that would be consistent with this. Additionally patient has elevated white count and fever prior to blood transfusion consistent with TOA.    Courtney Meadows 07/02/2024, 10:25 PM

## 2024-07-02 NOTE — ED Notes (Signed)
 Report given to [facility] of OB floor at [facility] at this time.

## 2024-07-02 NOTE — ED Notes (Signed)
 Patient transported to Ultrasound

## 2024-07-02 NOTE — ED Provider Notes (Signed)
 [place] [facility] AT [facility] Provider Note  CSN: [id number] Arrival date & time: 07/02/24 1552  Chief Complaint(s) Abdominal Pain  HPI Courtney Meadows is a 23 y.o. female without significant past medical history presenting to the emergency department left lower quadrant pain.  She reports left lower quadrant pain for around 2 weeks.  Reports the pain was worse so she came to the ER today.  Denies any nausea currently but had some earlier.  No vomiting.  She today felt a fever but otherwise has not had fevers at home.  Has been having increasing fatigue and malaise.  Reports currently on her menstrual cycle, does not think she has any particular heavy menstrual cycle.  Denies any blood in her stool or melena.  Denies any hematemesis.  Denies any other bleeding symptoms.   Past Medical History Past Medical History:  Diagnosis Date   Heart murmur    Patient Active Problem List   Diagnosis Date Noted   TOA (tubo-ovarian abscess) 07/02/2024   Body mass index (BMI) pediatric, 95th percentile for age to less than 120% of the 95th percentile for age 40/19/2025   Closed avulsion fracture of lateral malleolus of right fibula 02/25/2024   Overweight 02/25/2024   Sprain of left knee 02/25/2024   Well adolescent visit 02/25/2024   Tear of articular cartilage of left knee as current injury 09/07/2020   Antalgic gait 05/23/2020   Home Medication(s) Prior to Admission medications   Medication Sig Start Date End Date Taking? Authorizing Provider  hydrocortisone  cream 1 % Apply to affected area 2 times daily 08/15/17   Layden, Lindsey A, PA-C  ibuprofen  (ADVIL ,MOTRIN ) 600 MG tablet Take 1 tablet (600 mg total) by mouth every 6 (six) hours as needed. 10/27/18   Triplett, Tammy, PA-C  promethazine -dextromethorphan (PROMETHAZINE -DM) 6.25-15 MG/5ML syrup Take 5 mLs by mouth 4 (four) times daily as needed. 02/25/24   [name]                                                                                                                                     Past Surgical History Past Surgical History:  Procedure Laterality Date   KNEE SURGERY Left    Family History Family History  Problem Relation Age of Onset   Heart attack Father     Social History Social History   Tobacco Use   Smoking status: Never   Smokeless tobacco: Never  Vaping Use   Vaping status: Never Used  Substance Use Topics   Alcohol use: No   Drug use: No   Allergies Patient has no known allergies.  Review of Systems Review of Systems  All other systems reviewed and are negative.   Physical Exam Vital Signs  I have reviewed the triage vital signs BP 125/87   Pulse 99   Temp 99.9 F (37.7 C) (Oral)   Resp 18   Ht 5'  10 (1.778 m)   Wt 81.6 kg   LMP 06/30/2024 (Exact Date)   SpO2 100%   BMI 25.83 kg/m  Physical Exam Vitals and nursing note reviewed.  Constitutional:      General: She is not in acute distress.    Appearance: She is well-developed.  HENT:     Head: Normocephalic and atraumatic.     Mouth/Throat:     Mouth: Mucous membranes are moist.  Eyes:     Pupils: Pupils are equal, round, and reactive to light.  Cardiovascular:     Rate and Rhythm: Regular rhythm. Tachycardia present.     Heart sounds: No murmur heard. Pulmonary:     Effort: Pulmonary effort is normal. No respiratory distress.     Breath sounds: Normal breath sounds.  Abdominal:     General: Abdomen is flat.     Palpations: Abdomen is soft.     Tenderness: There is abdominal tenderness in the left lower quadrant. There is no right CVA tenderness or left CVA tenderness.  Musculoskeletal:        General: No tenderness.     Right lower leg: No edema.     Left lower leg: No edema.  Skin:    General: Skin is warm and dry.  Neurological:     General: No focal deficit present.     Mental Status: She is alert. Mental status is at baseline.  Psychiatric:        Mood and Affect: Mood  normal.        Behavior: Behavior normal.     ED Results and Treatments Labs (all labs ordered are listed, but only abnormal results are displayed) Labs Reviewed  COMPREHENSIVE METABOLIC PANEL WITH GFR - Abnormal; Notable for the following components:      Result Value   Sodium 134 (*)    Potassium 3.2 (*)    Chloride 96 (*)    BUN 5 (*)    Calcium 8.8 (*)    Total Protein 8.3 (*)    Albumin 3.2 (*)    All other components within normal limits  CBC WITH DIFFERENTIAL/PLATELET - Abnormal; Notable for the following components:   WBC 11.7 (*)    RBC 2.87 (*)    Hemoglobin 5.0 (*)    HCT 19.1 (*)    MCV 66.6 (*)    MCH 17.4 (*)    MCHC 26.2 (*)    RDW 30.1 (*)    Platelets 846 (*)    Neutro Abs 8.9 (*)    Monocytes Absolute 1.3 (*)    Abs Immature Granulocytes 0.08 (*)    All other components within normal limits  URINALYSIS, W/ REFLEX TO CULTURE (INFECTION SUSPECTED) - Abnormal; Notable for the following components:   Hgb urine dipstick LARGE (*)    Protein, ur 30 (*)    Leukocytes,Ua MODERATE (*)    Bacteria, UA RARE (*)    All other components within normal limits  IRON AND TIBC - Abnormal; Notable for the following components:   Iron 11 (*)    Saturation Ratios 3 (*)    All other components within normal limits  URINE CULTURE  PREGNANCY, URINE  TYPE AND SCREEN  PREPARE RBC (CROSSMATCH)  ABO/RH  GC/CHLAMYDIA PROBE AMP ([place]) NOT AT [facility]  GC/CHLAMYDIA PROBE AMP ([place]) NOT AT [facility]  Radiology CT ABDOMEN PELVIS W CONTRAST Result Date: 07/02/2024 CLINICAL DATA:  Left lower quadrant abdominal pain for 2 weeks. EXAM: CT ABDOMEN AND PELVIS WITH CONTRAST TECHNIQUE: Multidetector CT imaging of the abdomen and pelvis was performed using the standard protocol following bolus administration of intravenous contrast. RADIATION DOSE REDUCTION: This  exam was performed according to the departmental dose-optimization program which includes automated exposure control, adjustment of the mA and/or kV according to patient size and/or use of iterative reconstruction technique. CONTRAST:  OMNIPAQUE IOHEXOL 300 MG/ML  SOLN COMPARISON:  Pelvic ultrasound 07/02/2024 FINDINGS: Lower chest: Unremarkable Hepatobiliary: Unremarkable Pancreas: Unremarkable Spleen: The spleen measures 11.6 by 6.2 by 12.6 cm (volume = 470 cm^3), compatible with mild splenomegaly. Adrenals/Urinary Tract: Unremarkable Stomach/Bowel: The appendix is somewhat indistinct but is believed to course just above the right ovary for example on image 69 series 5. No dilated bowel. Vascular/Lymphatic: Unremarkable Reproductive: Cystic lesion of the right ovary with internal density 19 Hounsfield units and somewhat thick margins, measuring 6.3 by 4.9 by 6.7 cm. Complex left adnexal/ovarian lesions, favoring ovarian cystic lesions although possibly from hematosalpinx or pyosalpinx, characterized on prior ultrasound. No gas is identified within these complex ovarian structures. There is inflammatory stranding especially around the left ovary/adnexa. If the patient has signs of infection then tubo-ovarian abscesses might be considered. 3.9 cm soft tissue density lesion anterior to the uterine body on image 75 series 5 is nonspecific but probably an anterior subserosal fibroid. Other: Trace free pelvic fluid. Trace edema in the left lower quadrant omentum. Musculoskeletal: Unremarkable IMPRESSION: 1. Complex left adnexal/ovarian lesions, favoring ovarian cystic lesions although possibly from hematosalpinx or pyosalpinx, characterized on prior ultrasound. There is inflammatory stranding especially around the left ovary/adnexa. If the patient has signs of infection then tubo-ovarian abscesses might be considered. Urine pregnancy test negative hence ectopic pregnancy is not a consideration. Referring to the  prior ultrasound report, teratoma/dermoid is not likely given the lack of fat density on today's exam. A hemorrhagic or collapsed cyst is likewise a differential diagnostic consideration. Correlate with the patient's clinical scenario (pelvic exam, presence of fever/leukocytosis, and similar factors) to narrow the differential diagnosis. As noted on ultrasound, MRI may prove helpful in further characterization. 2. Cystic lesion of the right ovary with internal density 19 Hounsfield units and somewhat thick margins, measuring 6.3 by 4.9 by 6.7 cm. This is probably a hemorrhagic cyst but follow-up ultrasound is recommended in 6-12 weeks to confirm resolution. 3. Mild splenomegaly. 4. 3.9 cm soft tissue density lesion anterior to the uterine body is nonspecific but probably an anterior subserosal fibroid. Electronically Signed   By: Ryan Salvage M.D.   On: 07/02/2024 20:01   US  PELVIC COMPLETE W TRANSVAGINAL AND TORSION R/O Result Date: 07/02/2024 CLINICAL DATA:  Pelvic pain EXAM: TRANSABDOMINAL AND TRANSVAGINAL ULTRASOUND OF PELVIS TECHNIQUE: Both transabdominal and transvaginal ultrasound examinations of the pelvis were performed. Transabdominal technique was performed for global imaging of the pelvis including uterus, ovaries, adnexal regions, and pelvic cul-de-sac. It was necessary to proceed with endovaginal exam following the transabdominal exam to visualize the ovaries. COMPARISON:  None Available. FINDINGS: Uterus Measurements: 7.4 x 3.5 x 3.9 cm = volume: 53.5 3 mm mL. There is an anterior lower uterine segment fibroid measuring 4 x 3 x 4.5 cm. Endometrium Thickness: 3 mm.  No focal abnormality visualized. Right ovary Measurements: 7.5 x 5.4 x 6.8 cm = volume: 152 mL. There is a complex cystic structure measuring 7 x 5 x 6 cm with lace-like  internal solid components without significant increased vascularity Left ovary Measurements: 6.5 x 6 x 5.8 cm = volume: 117 mL. Solid cystic multiloculated mass  measuring 5.9 x 5.1 x 4.4 cm with some increased vascularity Other findings Trace pelvic  free fluid. IMPRESSION: Bilateral ovarian complex cysts may represent hemorrhagic cysts including endometriosis or representing teratoma/dermoid. Other cystic ovarian neoplasia cannot be excluded. Recommend short-term follow-up with pelvic MRI to ensure resolution. Lower anterior uterine segment subserosal fibroid. Electronically Signed   By: Megan  Zare M.D.   On: 07/02/2024 17:55    Pertinent labs & imaging results that were available during my care of the patient were reviewed by me and considered in my medical decision making (see MDM for details).  Medications Ordered in ED Medications  metroNIDAZOLE (FLAGYL) IVPB 500 mg (500 mg Intravenous New Bag/Given 07/02/24 2032)  ketorolac  (TORADOL ) 15 MG/ML injection 15 mg (15 mg Intravenous Given 07/02/24 1745)  lactated ringers bolus 1,000 mL (0 mLs Intravenous Stopping previously hung infusion 07/02/24 1950)  0.9 %  sodium chloride infusion (Manually program via Guardrails IV Fluids) ( Intravenous New Bag/Given 07/02/24 2031)  HYDROmorphone (DILAUDID) injection 0.5 mg (0.5 mg Intravenous Given 07/02/24 2032)  iohexol (OMNIPAQUE) 300 MG/ML solution 100 mL (100 mLs Intravenous Contrast Given 07/02/24 1944)  cefTRIAXone (ROCEPHIN) 2 g in sodium chloride 0.9 % 100 mL IVPB (2 g Intravenous New Bag/Given 07/02/24 2032)  doxycycline (VIBRA-TABS) tablet 100 mg (100 mg Oral Given 07/02/24 2032)                                                                                                                                     Procedures .Critical Care  Performed by: Francesca Elsie CROME, MD Authorized by: Francesca Elsie CROME, MD   Critical care provider statement:    Critical care time (minutes):  30   Critical care was necessary to treat or prevent imminent or life-threatening deterioration of the following conditions:  Circulatory failure   Critical care was time spent  personally by me on the following activities:  Development of treatment plan with patient or surrogate, discussions with consultants, evaluation of patient's response to treatment, examination of patient, ordering and review of laboratory studies, ordering and review of radiographic studies, ordering and performing treatments and interventions, pulse oximetry, re-evaluation of patient's condition and review of old charts   Care discussed with: admitting provider and accepting provider at another facility     (including critical care time)  Medical Decision Making / ED Course   MDM:  23 year old presenting to the emergency department with abdominal pain for 2 weeks.  Patient overall well-appearing, initial vital signs with fever, tachycardia.  No hypotension.  Patient reported pain felt more pelvic so pelvic ultrasound was obtained.  Initially showed bilateral cysts without torsion.  CT was also obtained to evaluate for other intra-abdominal cause, just showed large cyst with some surrounding inflammation on the left, could be  TOA given fever.  Will cover for TOA with antibiotics.  No other acute infectious process.  Workup also notable for hemoglobin of 5.  Seems most likely due to iron deficiency with microcytic anemia.  Patient with elevated platelets, lower concern for any intravascular hemolysis or other process.  Patient consented for transfusion.  Sent iron studies.  She does report ports she is currently on her menstrual cycle.  Considered ruptured hemorrhagic cyst however CT abdomen without signs of free fluid.  Given findings discussed with OB/GYN, patient accepted for admission by Dr. Fredirick.      Additional history obtained: -Additional history obtained from family -External records from outside source obtained and reviewed including: Chart review including previous notes, labs, imaging, consultation notes including prior notes    Lab Tests: -I ordered, reviewed, and interpreted  labs.   The pertinent results include:   Labs Reviewed  COMPREHENSIVE METABOLIC PANEL WITH GFR - Abnormal; Notable for the following components:      Result Value   Sodium 134 (*)    Potassium 3.2 (*)    Chloride 96 (*)    BUN 5 (*)    Calcium 8.8 (*)    Total Protein 8.3 (*)    Albumin 3.2 (*)    All other components within normal limits  CBC WITH DIFFERENTIAL/PLATELET - Abnormal; Notable for the following components:   WBC 11.7 (*)    RBC 2.87 (*)    Hemoglobin 5.0 (*)    HCT 19.1 (*)    MCV 66.6 (*)    MCH 17.4 (*)    MCHC 26.2 (*)    RDW 30.1 (*)    Platelets 846 (*)    Neutro Abs 8.9 (*)    Monocytes Absolute 1.3 (*)    Abs Immature Granulocytes 0.08 (*)    All other components within normal limits  URINALYSIS, W/ REFLEX TO CULTURE (INFECTION SUSPECTED) - Abnormal; Notable for the following components:   Hgb urine dipstick LARGE (*)    Protein, ur 30 (*)    Leukocytes,Ua MODERATE (*)    Bacteria, UA RARE (*)    All other components within normal limits  IRON AND TIBC - Abnormal; Notable for the following components:   Iron 11 (*)    Saturation Ratios 3 (*)    All other components within normal limits  URINE CULTURE  PREGNANCY, URINE  TYPE AND SCREEN  PREPARE RBC (CROSSMATCH)  ABO/RH  GC/CHLAMYDIA PROBE AMP ([place]) NOT AT [facility]  GC/CHLAMYDIA PROBE AMP () NOT AT [facility]    Notable for anemia, leukocytosis    Imaging Studies ordered: I ordered imaging studies including pelvic US , CT abdomen On my interpretation imaging demonstrates no acute process I independently visualized and interpreted imaging. I agree with the radiologist interpretation   Medicines ordered and prescription drug management: Meds ordered this encounter  Medications   ketorolac  (TORADOL ) 15 MG/ML injection 15 mg   lactated ringers bolus 1,000 mL   0.9 %  sodium chloride infusion (Manually program via Guardrails IV Fluids)   HYDROmorphone (DILAUDID) injection 0.5 mg    iohexol (OMNIPAQUE) 300 MG/ML solution 100 mL   cefTRIAXone (ROCEPHIN) 2 g in sodium chloride 0.9 % 100 mL IVPB    Antibiotic Indication::   STD   metroNIDAZOLE (FLAGYL) IVPB 500 mg   doxycycline (VIBRA-TABS) tablet 100 mg    -I have reviewed the patients home medicines and have made adjustments as needed   Consultations Obtained: I requested consultation with the ob/gyn,  and discussed lab and imaging findings as well as pertinent plan - they recommend: admit to ob/gyn   Cardiac Monitoring: The patient was maintained on a cardiac monitor.  I personally viewed and interpreted the cardiac monitored which showed an underlying rhythm of: sinus tachycardia   Reevaluation: After the interventions noted above, I reevaluated the patient and found that their symptoms have improved  Co morbidities that complicate the patient evaluation  Past Medical History:  Diagnosis Date   Heart murmur       Dispostion: Disposition decision including need for hospitalization was considered, and patient admitted to the hospital.    Final Clinical Impression(s) / ED Diagnoses Final diagnoses:  TOA (tubo-ovarian abscess)  Symptomatic anemia     This chart was dictated using voice recognition software.  Despite best efforts to proofread,  errors can occur which can change the documentation meaning.    Francesca Elsie CROME, MD 07/02/24 2110

## 2024-07-03 LAB — CBC
HCT: 24.7 % — ABNORMAL LOW (ref 36.0–46.0)
Hemoglobin: 7.3 g/dL — ABNORMAL LOW (ref 12.0–15.0)
MCH: 21.1 pg — ABNORMAL LOW (ref 26.0–34.0)
MCHC: 29.6 g/dL — ABNORMAL LOW (ref 30.0–36.0)
MCV: 71.4 fL — ABNORMAL LOW (ref 80.0–100.0)
Platelets: 607 K/uL — ABNORMAL HIGH (ref 150–400)
RBC: 3.46 MIL/uL — ABNORMAL LOW (ref 3.87–5.11)
RDW: 29.9 % — ABNORMAL HIGH (ref 11.5–15.5)
WBC: 8.7 K/uL (ref 4.0–10.5)
nRBC: 0.2 % (ref 0.0–0.2)

## 2024-07-03 LAB — URINE CULTURE: Culture: NO GROWTH

## 2024-07-03 LAB — PREPARE RBC (CROSSMATCH)

## 2024-07-03 MED ORDER — SODIUM CHLORIDE 0.9 % IV SOLN
2.0000 g | Freq: Four times a day (QID) | INTRAVENOUS | Status: DC
  Administered 2024-07-03 – 2024-07-06 (×12): 2 g via INTRAVENOUS
  Filled 2024-07-03 (×14): qty 2

## 2024-07-03 MED ORDER — FUROSEMIDE 10 MG/ML IJ SOLN
20.0000 mg | Freq: Once | INTRAMUSCULAR | Status: AC
  Administered 2024-07-03: 20 mg via INTRAVENOUS
  Filled 2024-07-03: qty 2

## 2024-07-03 MED ORDER — SODIUM CHLORIDE 0.9% IV SOLUTION: Freq: Once | INTRAVENOUS | Status: AC

## 2024-07-03 MED ORDER — DIPHENHYDRAMINE HCL 25 MG PO CAPS
25.0000 mg | ORAL_CAPSULE | Freq: Once | ORAL | Status: AC
  Administered 2024-07-03: 25 mg via ORAL
  Filled 2024-07-03: qty 1

## 2024-07-03 MED ORDER — ACETAMINOPHEN 325 MG PO TABS
650.0000 mg | ORAL_TABLET | Freq: Once | ORAL | Status: AC
  Administered 2024-07-03: 650 mg via ORAL
  Filled 2024-07-03: qty 2

## 2024-07-03 MED ORDER — SODIUM CHLORIDE 0.9 % IV SOLN
2.0000 g | Freq: Four times a day (QID) | INTRAVENOUS | Status: DC
  Filled 2024-07-03 (×2): qty 2

## 2024-07-03 MED ORDER — DEXTROSE 5 % IV SOLN
2.0000 g | Freq: Four times a day (QID) | INTRAVENOUS | Status: DC
  Filled 2024-07-03 (×2): qty 2

## 2024-07-03 NOTE — Plan of Care (Signed)

## 2024-07-04 ENCOUNTER — Encounter (HOSPITAL_COMMUNITY): Payer: Self-pay | Admitting: Family Medicine

## 2024-07-04 DIAGNOSIS — N7093 Salpingitis and oophoritis, unspecified: Principal | ICD-10-CM

## 2024-07-04 LAB — BPAM RBC
Blood Product Expiration Date: 202508162359
Blood Product Expiration Date: 202508162359
Blood Product Expiration Date: 202508212359
Blood Product Expiration Date: 202508222359
ISSUE DATE / TIME: 202507260032
ISSUE DATE / TIME: 202507260432
ISSUE DATE / TIME: 202507261125
ISSUE DATE / TIME: 202507261422
Unit Type and Rh: 5100
Unit Type and Rh: 5100
Unit Type and Rh: 5100
Unit Type and Rh: 5100

## 2024-07-04 LAB — TYPE AND SCREEN
ABO/RH(D): O POS
Antibody Screen: NEGATIVE
Unit division: 0
Unit division: 0
Unit division: 0
Unit division: 0

## 2024-07-04 LAB — RAPID HIV SCREEN (HIV 1/2 AB+AG)
HIV 1/2 Antibodies: NONREACTIVE
HIV-1 P24 Antigen - HIV24: NONREACTIVE

## 2024-07-04 LAB — CBC
HCT: 30.6 % — ABNORMAL LOW (ref 36.0–46.0)
Hemoglobin: 9.6 g/dL — ABNORMAL LOW (ref 12.0–15.0)
MCH: 23.4 pg — ABNORMAL LOW (ref 26.0–34.0)
MCHC: 31.4 g/dL (ref 30.0–36.0)
MCV: 74.5 fL — ABNORMAL LOW (ref 80.0–100.0)
Platelets: 505 K/uL — ABNORMAL HIGH (ref 150–400)
RBC: 4.11 MIL/uL (ref 3.87–5.11)
RDW: 27.8 % — ABNORMAL HIGH (ref 11.5–15.5)
WBC: 7.2 K/uL (ref 4.0–10.5)
nRBC: 0 % (ref 0.0–0.2)

## 2024-07-04 LAB — HEPATITIS B SURFACE ANTIGEN: Hepatitis B Surface Ag: NONREACTIVE

## 2024-07-04 LAB — RPR: RPR Ser Ql: NONREACTIVE

## 2024-07-04 MED ORDER — SODIUM CHLORIDE 0.9 % IV SOLN: 2.0000 g | Freq: Four times a day (QID) | INTRAVENOUS | Status: DC

## 2024-07-04 NOTE — Progress Notes (Signed)
 Patient ID: Courtney Meadows, female   DOB: 03-Nov-2001, 23 y.o.   MRN: [id number]   Left TOA, right hemorrhagic ovarian cyst, severe iron deficiency anemia  Courtney Meadows is a 23 y.o. female patient.   1. TOA (tubo-ovarian abscess)   2. Symptomatic anemia     Past Medical History:  Diagnosis Date   Heart murmur     No past surgical history pertinent negatives on file.  Scheduled Meds:  sodium chloride    Intravenous Once    Continuous Infusions:  cefOXitin  Stopped (07/04/24 0600)   doxycycline  (VIBRAMYCIN ) IV 100 mg (07/04/24 0945)   metronidazole  500 mg (07/04/24 0818)    PRN Meds:alum & mag hydroxide-simeth, guaiFENesin , HYDROmorphone  (DILAUDID ) injection, ibuprofen , menthol -cetylpyridinium, ondansetron  **OR** ondansetron  (ZOFRAN ) IV, oxyCODONE -acetaminophen , polyethylene glycol  No Known Allergies  Principal Problem:   TOA (tubo-ovarian abscess)   Subjective   Pt feels about the same No bleeding Apetite is stable No nausea and vomiting today Tmax last night 102.4  Objective   Vitals:   07/03/24 2108 07/03/24 2350 07/04/24 0347 07/04/24 0820  BP:  121/78 119/83 (!) 134/95  Pulse:  (!) 59 (!) 57 69  Resp:  18 18 17   Temp: 99.6 F (37.6 C) 98.2 F (36.8 C) 97.8 F (36.6 C) 98.4 F (36.9 C)  TempSrc: Oral Oral Oral Oral  SpO2:  95% 100% 100%  Weight:   84.2 kg   Height:       Vitals:   07/03/24 0510 07/03/24 0704 07/03/24 1115 07/03/24 1148  BP: 117/67 121/67 125/72 119/69   07/03/24 1356 07/03/24 1431 07/03/24 1445 07/03/24 1646  BP: 122/75 123/76 125/88 113/60   07/03/24 2007 07/03/24 2350 07/04/24 0347 07/04/24 0820  BP: 128/80 121/78 119/83 (!) 134/95     Subjective Objective: Vital signs (most recent): Blood pressure (!) 134/95, pulse 69, temperature 98.4 F (36.9 C), temperature source Oral, resp. rate 17, height 5' 10 (1.778 m), weight 84.2 kg, last menstrual period 06/30/2024, SpO2 100%.   Gen  WDWN NAD Abdomen  soft tender LLQ no rebound, RLQ  not really tender Incision  na     Latest Ref Rng & Units 07/04/2024    4:18 AM 07/03/2024    9:03 AM 07/02/2024    4:55 PM  CBC  WBC 4.0 - 10.5 K/uL 7.2  8.7  11.7   Hemoglobin 12.0 - 15.0 g/dL 9.6  7.3  5.0   Hematocrit 36.0 - 46.0 % 30.6  24.7  19.1   Platelets 150 - 400 K/uL 505  607  846        Latest Ref Rng & Units 07/02/2024    4:55 PM  CMP  Glucose 70 - 99 mg/dL 96   BUN 6 - 20 mg/dL 5   Creatinine 9.55 - 8.99 mg/dL 9.30   Sodium 864 - 854 mmol/L 134   Potassium 3.5 - 5.1 mmol/L 3.2   Chloride 98 - 111 mmol/L 96   CO2 22 - 32 mmol/L 24   Calcium 8.9 - 10.3 mg/dL 8.8   Total Protein 6.5 - 8.1 g/dL 8.3   Total Bilirubin 0.0 - 1.2 mg/dL 0.5   Alkaline Phos 38 - 126 U/L 40   AST 15 - 41 U/L 19   ALT 0 - 44 U/L 9      Assessment & Plan HD #3 technically but has been on ABX for about 36 hours  Evaluate for IR drainage tomorrow of the left TOA, right ovarian cyst as well if they choose  but doubt will need drain left in for that, mostly centered around the [facility] on left since she had temp elevation last noght, NPO and not on lovenox anyway  I will follow her up at [facility] for OP follow up  Vonn VEAR Inch, MD 07/04/2024, 12:02 PM

## 2024-07-04 NOTE — Plan of Care (Signed)

## 2024-07-05 ENCOUNTER — Inpatient Hospital Stay (HOSPITAL_COMMUNITY)

## 2024-07-05 LAB — GC/CHLAMYDIA PROBE AMP (~~LOC~~) NOT AT ARMC
Chlamydia: POSITIVE — AB
Comment: NEGATIVE
Comment: NORMAL
Neisseria Gonorrhea: POSITIVE — AB

## 2024-07-05 LAB — CBC WITH DIFFERENTIAL/PLATELET
Abs Immature Granulocytes: 0.02 K/uL (ref 0.00–0.07)
Basophils Absolute: 0 K/uL (ref 0.0–0.1)
Basophils Relative: 0 %
Eosinophils Absolute: 0.1 K/uL (ref 0.0–0.5)
Eosinophils Relative: 1 %
HCT: 31.1 % — ABNORMAL LOW (ref 36.0–46.0)
Hemoglobin: 9.6 g/dL — ABNORMAL LOW (ref 12.0–15.0)
Immature Granulocytes: 0 %
Lymphocytes Relative: 19 %
Lymphs Abs: 1.2 K/uL (ref 0.7–4.0)
MCH: 23.3 pg — ABNORMAL LOW (ref 26.0–34.0)
MCHC: 30.9 g/dL (ref 30.0–36.0)
MCV: 75.5 fL — ABNORMAL LOW (ref 80.0–100.0)
Monocytes Absolute: 0.6 K/uL (ref 0.1–1.0)
Monocytes Relative: 10 %
Neutro Abs: 4.2 K/uL (ref 1.7–7.7)
Neutrophils Relative %: 70 %
Platelets: 495 K/uL — ABNORMAL HIGH (ref 150–400)
RBC: 4.12 MIL/uL (ref 3.87–5.11)
RDW: 28.9 % — ABNORMAL HIGH (ref 11.5–15.5)
WBC: 6.1 K/uL (ref 4.0–10.5)
nRBC: 0 % (ref 0.0–0.2)

## 2024-07-05 MED ORDER — FENTANYL CITRATE (PF) 100 MCG/2ML IJ SOLN
INTRAMUSCULAR | Status: AC
Start: 2024-07-05 — End: 2024-07-05
  Filled 2024-07-05: qty 4

## 2024-07-05 MED ORDER — LIDOCAINE HCL 1 % IJ SOLN
10.0000 mL | Freq: Once | INTRAMUSCULAR | Status: AC
  Administered 2024-07-05: 10 mL via INTRADERMAL

## 2024-07-05 MED ORDER — MIDAZOLAM HCL 2 MG/2ML IJ SOLN
INTRAMUSCULAR | Status: AC
  Filled 2024-07-05: qty 4

## 2024-07-05 MED ORDER — LACTATED RINGERS IV SOLN: INTRAVENOUS | Status: AC

## 2024-07-05 MED ORDER — MIDAZOLAM HCL 2 MG/2ML IJ SOLN
INTRAMUSCULAR | Status: AC | PRN
  Administered 2024-07-05: 1 mg via INTRAVENOUS
  Administered 2024-07-05: .5 mg via INTRAVENOUS
  Administered 2024-07-05: 1 mg via INTRAVENOUS

## 2024-07-05 MED ORDER — FENTANYL CITRATE (PF) 100 MCG/2ML IJ SOLN
INTRAMUSCULAR | Status: AC | PRN
  Administered 2024-07-05: 25 ug via INTRAVENOUS
  Administered 2024-07-05 (×2): 50 ug via INTRAVENOUS

## 2024-07-05 NOTE — Progress Notes (Signed)
 Gynecology Progress Note  Admission Date: 07/02/2024 Current Date: 07/05/2024 9:29 AM  Courtney Meadows is a 23 y.o. No obstetric history on file. HD#3 admitted for TOA, right hemorrhagic cyst and iron deficiency anemia.   History complicated by: Patient Active Problem List   Diagnosis Date Noted   TOA (tubo-ovarian abscess) 07/02/2024   Body mass index (BMI) pediatric, 95th percentile for age to less than 120% of the 95th percentile for age 71/19/2025   Closed avulsion fracture of lateral malleolus of right fibula 02/25/2024   Overweight 02/25/2024   Sprain of left knee 02/25/2024   Well adolescent visit 02/25/2024   Tear of articular cartilage of left knee as current injury 09/07/2020   Antalgic gait 05/23/2020    ROS and patient/family/surgical history, located on admission H&P note dated 07/02/2024, have been reviewed, and there are no changes except as noted below Yesterday/Overnight Events:  None significant  Subjective:  Pt sleeping, easily aroused.  She notes sporadic chills overnight, but none currently.  She is tolerating regular diet and denies n/v.  Still notes sporadic left sided pain, but none currently.  The pain has improved since admission.  Objective:   Vitals:   07/05/24 0039 07/05/24 0615 07/05/24 0635 07/05/24 0805  BP: (!) 110/48 122/80  124/72  Pulse: 96 (!) 57  (!) 54  Resp: 16 17  16   Temp: 99 F (37.2 C) 98.3 F (36.8 C)  98.4 F (36.9 C)  TempSrc: Oral Oral  Oral  SpO2: 99% 100%  99%  Weight:   84.6 kg   Height:        Temp:  [98.2 F (36.8 C)-99 F (37.2 C)] 98.4 F (36.9 C) (07/28 0805) Pulse Rate:  [54-96] 54 (07/28 0805) Resp:  [16-18] 16 (07/28 0805) BP: (109-135)/(48-83) 124/72 (07/28 0805) SpO2:  [99 %-100 %] 99 % (07/28 0805) Weight:  [84.6 kg] 84.6 kg (07/28 0635) I/O last 3 completed shifts: In: 2584 [I.V.:363; IV Piggyback:2221] Out: -  No intake/output data recorded.  Intake/Output Summary (Last 24 hours) at 07/05/2024 0929 Last  data filed at 07/05/2024 9364 Gross per 24 hour  Intake 1566.89 ml  Output --  Net 1566.89 ml     Current Vital Signs 24h Vital Sign Ranges  T 98.4 F (36.9 C) Temp  Avg: 98.6 F (37 C)  Min: 98.2 F (36.8 C)  Max: 99 F (37.2 C)  BP 124/72 BP  Min: 109/59  Max: 135/83  HR (!) 54 Pulse  Avg: 73.3  Min: 54  Max: 96  RR 16 Resp  Avg: 17  Min: 16  Max: 18  SaO2 99 % Room Air SpO2  Avg: 99.5 %  Min: 99 %  Max: 100 %       24 Hour I/O Current Shift I/O  Time Ins Outs 07/27 0701 - 07/28 0700 In: 1566.9 [I.V.:363] Out: -  No intake/output data recorded.   Patient Vitals for the past 12 hrs:  BP Temp Temp src Pulse Resp SpO2 Weight  07/05/24 0805 124/72 98.4 F (36.9 C) Oral (!) 54 16 99 % --  07/05/24 0635 -- -- -- -- -- -- 84.6 kg  07/05/24 0615 122/80 98.3 F (36.8 C) Oral (!) 57 17 100 % --  07/05/24 0039 (!) 110/48 99 F (37.2 C) Oral 96 16 99 % --     Patient Vitals for the past 24 hrs:  BP Temp Temp src Pulse Resp SpO2 Weight  07/05/24 0805 124/72 98.4 F (36.9 C) Oral ROLLEN)  54 16 99 % --  07/05/24 0635 -- -- -- -- -- -- 84.6 kg  07/05/24 0615 122/80 98.3 F (36.8 C) Oral (!) 57 17 100 % --  07/05/24 0039 (!) 110/48 99 F (37.2 C) Oral 96 16 99 % --  07/04/24 1923 135/83 99 F (37.2 C) Oral 70 18 100 % --  07/04/24 1638 (!) 109/59 98.2 F (36.8 C) Oral 87 17 100 % --  07/04/24 1225 133/82 98.9 F (37.2 C) Oral 76 18 99 % --    Physical exam: General appearance: alert, cooperative, appears stated age, and no distress Abdomen: soft, mild tenderness in left lower quadrant, no distension GU: No gross VB Lungs: clear to auscultation bilaterally Heart: regular rate and rhythm Extremities: no lower extremity edema Skin: WNL Psych: appropriate Neurologic: Grossly normal  Medications Current Facility-Administered Medications  Medication Dose Route Frequency Provider Last Rate Last Admin   0.9 %  sodium chloride  infusion (Manually program via Guardrails IV  Fluids)   Intravenous Once Fredirick Glenys RAMAN, MD   Stopped at 07/03/24 1822   alum & mag hydroxide-simeth (MAALOX/MYLANTA) 200-200-20 MG/5ML suspension 30 mL  30 mL Oral Q4H PRN Fredirick Glenys RAMAN, MD       cefOXitin  (MEFOXIN ) 2 g in sodium chloride  0.9 % 100 mL IVPB  2 g Intravenous Q6H Fredirick Glenys RAMAN, MD 200 mL/hr at 07/05/24 0800 2 g at 07/05/24 0800   doxycycline  (VIBRAMYCIN ) 100 mg in sodium chloride  0.9 % 250 mL IVPB  100 mg Intravenous Q12H Fredirick Glenys RAMAN, MD   Stopped at 07/05/24 0151   guaiFENesin  (ROBITUSSIN) 100 MG/5ML liquid 15 mL  15 mL Oral Q4H PRN Fredirick Glenys RAMAN, MD       HYDROmorphone  (DILAUDID ) injection 0.2-0.6 mg  0.2-0.6 mg Intravenous Q2H PRN Fredirick Glenys RAMAN, MD       ibuprofen  (ADVIL ) tablet 600 mg  600 mg Oral Q6H PRN Fredirick Glenys RAMAN, MD   600 mg at 07/04/24 2028   lactated ringers  infusion   Intravenous Continuous Fredirick Glenys RAMAN, MD 100 mL/hr at 07/05/24 0257 New Bag at 07/05/24 0257   menthol -cetylpyridinium (CEPACOL) lozenge 3 mg  1 lozenge Oral Q2H PRN Fredirick Glenys RAMAN, MD       metroNIDAZOLE  (FLAGYL ) IVPB 500 mg  500 mg Intravenous Q12H Fredirick Glenys RAMAN, MD 100 mL/hr at 07/05/24 0837 500 mg at 07/05/24 0837   ondansetron  (ZOFRAN ) tablet 4 mg  4 mg Oral Q6H PRN Fredirick Glenys RAMAN, MD   4 mg at 07/02/24 2338   Or   ondansetron  (ZOFRAN ) injection 4 mg  4 mg Intravenous Q6H PRN Fredirick Glenys RAMAN, MD       oxyCODONE -acetaminophen  (PERCOCET/ROXICET) 5-325 MG per tablet 1-2 tablet  1-2 tablet Oral Q3H PRN Fredirick Glenys RAMAN, MD   2 tablet at 07/03/24 2008   polyethylene glycol (MIRALAX  / GLYCOLAX ) packet 17 g  17 g Oral Daily PRN Fredirick Glenys RAMAN, MD          Labs  Recent Labs  Lab 07/03/24 [phone] 07/04/24 0418 07/05/24 0444  WBC 8.7 7.2 6.1  HGB 7.3* 9.6* 9.6*  HCT 24.7* 30.6* 31.1*  PLT 607* 505* 495*    Recent Labs  Lab 07/02/24 1655  NA 134*  K 3.2*  CL 96*  CO2 24  BUN 5*  CREATININE 0.69  CALCIUM 8.8*  PROT 8.3*  BILITOT 0.5  ALKPHOS 40  ALT 9  AST 19  GLUCOSE 96     Radiology Repeat  CT and possible drain pending  Assessment & Plan:  Left TOA: continue current abx, reeval by IR for possible CT guided drainage, pt currently NPO, clinically looks stable.  Labs and objective findings stable, WBC improved and no recorded fevers for about 24 hours.  Appreciate IR assistance  Symptomatic anemia:  hgb stable, no ongoing blood loss noted Right hemorrhagic cyst: can reassess with CT today, but can be followed expectantly, would get follow up u/s in 2-3 months. Code Status: Full Code   Jerilynn Buddle, MD Attending Center for [facility])

## 2024-07-05 NOTE — Consult Note (Signed)
 Chief Complaint: Left tubo-ovarian abscess. Request is for aspiration possible drain placement  Referring Physician(s): Dr. Vonn Inch  Supervising Physician: Jenna Hacker  Patient Status: [facility] - In-pt  History of Present Illness: Courtney Meadows is a 23 y.o. female inpatient. History of heart murmer. Presented to the ED at [facility] on 7.25.25 with LLQ pain X 2 week. Found to have a left TOA,  a right ovary lesion and significant anemia.. CT abd pelvis reads Complex left adnexal/ovarian lesions, favoring ovarian cystic lesions although possibly from hematosalpinx or pyosalpinx, characterized on prior ultrasound. There is inflammatory stranding especially around the left ovary/adnexa. If the patient has signs of infection then tubo-ovarian abscesses might be considered. Urine pregnancy test negative hence ectopic pregnancy is not a consideration. Referring to the prior ultrasound report, teratoma/dermoid is not likely given the lack of fat density on today's exam. A hemorrhagic or collapsed cyst is likewise a differential diagnostic consideration. Correlate with the patient's clinical scenario (pelvic exam, presence of fever/leukocytosis, and similar factors) to narrow the differential diagnosis. As noted on ultrasound, MRI may prove helpful in further characterization. 2. Cystic lesion of the right ovary with internal density 19 Hounsfield units and somewhat thick margins, measuring 6.3 by 4.9 by 6.7 cm.Team is requesting left tubo-ovarian aspiration possible abscess drain placement.  Mother at bedside.Currently without any significant complaints. Patient alert and laying in bed,calm. Denies any fevers, headache, chest pain, SOB, cough, abdominal pain, nausea, vomiting or bleeding.    Hgb 9.6< 7.3<  5.0, WBC 7.2< 8.7< 11.7. Patent is afebrile. All other labs and medications are within acceptable parameters. NKDA. NPO since midnight.   Return precautions and treatment recommendations and follow-up  discussed with the patient and her other at bedside. Both who are agreeable with the plan.      Past Medical History:  Diagnosis Date   Heart murmur     Past Surgical History:  Procedure Laterality Date   KNEE SURGERY Left     Allergies: Patient has no known allergies.  Medications: Prior to Admission medications   Medication Sig Start Date End Date Taking? Authorizing Provider  hydrocortisone  cream 1 % Apply to affected area 2 times daily 08/15/17   Layden, Lindsey A, PA-C  ibuprofen  (ADVIL ,MOTRIN ) 600 MG tablet Take 1 tablet (600 mg total) by mouth every 6 (six) hours as needed. 10/27/18   Triplett, Tammy, PA-C  promethazine -dextromethorphan (PROMETHAZINE -DM) 6.25-15 MG/5ML syrup Take 5 mLs by mouth 4 (four) times daily as needed. 02/25/24   Arvis Jolan NOVAK, PA-C     Family History  Problem Relation Age of Onset   Heart attack Father     Social History   Socioeconomic History   Marital status: Single    Spouse name: Not on file   Number of children: Not on file   Years of education: Not on file   Highest education level: Not on file  Occupational History   Not on file  Tobacco Use   Smoking status: Never   Smokeless tobacco: Never  Vaping Use   Vaping status: Never Used  Substance and Sexual Activity   Alcohol use: No   Drug use: No   Sexual activity: Not on file  Other Topics Concern   Not on file  Social History Narrative   Not on file   Social Drivers of Health   Financial Resource Strain: Not on file  Food Insecurity: No Food Insecurity (07/02/2024)   Hunger Vital Sign    Worried About Running Out of  Food in the Last Year: Never true    Ran Out of Food in the Last Year: Never true  Transportation Needs: No Transportation Needs (07/02/2024)   PRAPARE - Administrator, Civil Service (Medical): No    Lack of Transportation (Non-Medical): No  Physical Activity: Not on file  Stress: Not on file  Social Connections: Not on file     Review  of Systems: A 12 point ROS discussed and pertinent positives are indicated in the HPI above.  All other systems are negative.  Review of Systems  Constitutional:  Negative for fatigue and fever.  HENT:  Negative for congestion.   Respiratory:  Negative for cough and shortness of breath.   Gastrointestinal:  Negative for abdominal pain, diarrhea, nausea and vomiting.    Vital Signs: BP 124/72 (BP Location: Left Arm)   Pulse (!) 54   Temp 98.4 F (36.9 C) (Oral)   Resp 16   Ht 5' 10 (1.778 m)   Wt 186 lb 6.4 oz (84.6 kg)   LMP 06/30/2024 (Exact Date)   SpO2 99%   BMI 26.75 kg/m     Physical Exam Vitals and nursing note reviewed.  Constitutional:      Appearance: She is well-developed.  HENT:     Head: Normocephalic and atraumatic.  Eyes:     Conjunctiva/sclera: Conjunctivae normal.  Cardiovascular:     Rate and Rhythm: Bradycardia present.  Pulmonary:     Effort: Pulmonary effort is normal.  Musculoskeletal:        General: Normal range of motion.     Cervical back: Normal range of motion.  Skin:    General: Skin is warm.  Neurological:     General: No focal deficit present.     Mental Status: She is alert and oriented to person, place, and time.  Psychiatric:        Mood and Affect: Mood normal.        Behavior: Behavior normal.     Imaging: CT ABDOMEN PELVIS W CONTRAST Result Date: 07/02/2024 CLINICAL DATA:  Left lower quadrant abdominal pain for 2 weeks. EXAM: CT ABDOMEN AND PELVIS WITH CONTRAST TECHNIQUE: Multidetector CT imaging of the abdomen and pelvis was performed using the standard protocol following bolus administration of intravenous contrast. RADIATION DOSE REDUCTION: This exam was performed according to the departmental dose-optimization program which includes automated exposure control, adjustment of the mA and/or kV according to patient size and/or use of iterative reconstruction technique. CONTRAST:  OMNIPAQUE  IOHEXOL  300 MG/ML  SOLN  COMPARISON:  Pelvic ultrasound 07/02/2024 FINDINGS: Lower chest: Unremarkable Hepatobiliary: Unremarkable Pancreas: Unremarkable Spleen: The spleen measures 11.6 by 6.2 by 12.6 cm (volume = 470 cm^3), compatible with mild splenomegaly. Adrenals/Urinary Tract: Unremarkable Stomach/Bowel: The appendix is somewhat indistinct but is believed to course just above the right ovary for example on image 69 series 5. No dilated bowel. Vascular/Lymphatic: Unremarkable Reproductive: Cystic lesion of the right ovary with internal density 19 Hounsfield units and somewhat thick margins, measuring 6.3 by 4.9 by 6.7 cm. Complex left adnexal/ovarian lesions, favoring ovarian cystic lesions although possibly from hematosalpinx or pyosalpinx, characterized on prior ultrasound. No gas is identified within these complex ovarian structures. There is inflammatory stranding especially around the left ovary/adnexa. If the patient has signs of infection then tubo-ovarian abscesses might be considered. 3.9 cm soft tissue density lesion anterior to the uterine body on image 75 series 5 is nonspecific but probably an anterior subserosal fibroid. Other: Trace free pelvic fluid.  Trace edema in the left lower quadrant omentum. Musculoskeletal: Unremarkable IMPRESSION: 1. Complex left adnexal/ovarian lesions, favoring ovarian cystic lesions although possibly from hematosalpinx or pyosalpinx, characterized on prior ultrasound. There is inflammatory stranding especially around the left ovary/adnexa. If the patient has signs of infection then tubo-ovarian abscesses might be considered. Urine pregnancy test negative hence ectopic pregnancy is not a consideration. Referring to the prior ultrasound report, teratoma/dermoid is not likely given the lack of fat density on today's exam. A hemorrhagic or collapsed cyst is likewise a differential diagnostic consideration. Correlate with the patient's clinical scenario (pelvic exam, presence of  fever/leukocytosis, and similar factors) to narrow the differential diagnosis. As noted on ultrasound, MRI may prove helpful in further characterization. 2. Cystic lesion of the right ovary with internal density 19 Hounsfield units and somewhat thick margins, measuring 6.3 by 4.9 by 6.7 cm. This is probably a hemorrhagic cyst but follow-up ultrasound is recommended in 6-12 weeks to confirm resolution. 3. Mild splenomegaly. 4. 3.9 cm soft tissue density lesion anterior to the uterine body is nonspecific but probably an anterior subserosal fibroid. Electronically Signed   By: Ryan Salvage M.D.   On: 07/02/2024 20:01   US  PELVIC COMPLETE W TRANSVAGINAL AND TORSION R/O Result Date: 07/02/2024 CLINICAL DATA:  Pelvic pain EXAM: TRANSABDOMINAL AND TRANSVAGINAL ULTRASOUND OF PELVIS TECHNIQUE: Both transabdominal and transvaginal ultrasound examinations of the pelvis were performed. Transabdominal technique was performed for global imaging of the pelvis including uterus, ovaries, adnexal regions, and pelvic cul-de-sac. It was necessary to proceed with endovaginal exam following the transabdominal exam to visualize the ovaries. COMPARISON:  None Available. FINDINGS: Uterus Measurements: 7.4 x 3.5 x 3.9 cm = volume: 53.5 3 mm mL. There is an anterior lower uterine segment fibroid measuring 4 x 3 x 4.5 cm. Endometrium Thickness: 3 mm.  No focal abnormality visualized. Right ovary Measurements: 7.5 x 5.4 x 6.8 cm = volume: 152 mL. There is a complex cystic structure measuring 7 x 5 x 6 cm with lace-like internal solid components without significant increased vascularity Left ovary Measurements: 6.5 x 6 x 5.8 cm = volume: 117 mL. Solid cystic multiloculated mass measuring 5.9 x 5.1 x 4.4 cm with some increased vascularity Other findings Trace pelvic  free fluid. IMPRESSION: Bilateral ovarian complex cysts may represent hemorrhagic cysts including endometriosis or representing teratoma/dermoid. Other cystic ovarian  neoplasia cannot be excluded. Recommend short-term follow-up with pelvic MRI to ensure resolution. Lower anterior uterine segment subserosal fibroid. Electronically Signed   By: Megan  Zare M.D.   On: 07/02/2024 17:55    Labs:  CBC: Recent Labs    07/02/24 1655 07/03/24 0903 07/04/24 0418 07/05/24 0444  WBC 11.7* 8.7 7.2 6.1  HGB 5.0* 7.3* 9.6* 9.6*  HCT 19.1* 24.7* 30.6* 31.1*  PLT 846* 607* 505* 495*    COAGS: No results for input(s): INR, APTT in the last 8760 hours.  BMP: Recent Labs    07/02/24 1655  NA 134*  K 3.2*  CL 96*  CO2 24  GLUCOSE 96  BUN 5*  CALCIUM 8.8*  CREATININE 0.69  GFRNONAA >60    LIVER FUNCTION TESTS: Recent Labs    07/02/24 1655  BILITOT 0.5  AST 19  ALT 9  ALKPHOS 40  PROT 8.3*  ALBUMIN 3.2*    TUMOR MARKERS: No results for input(s): AFPTM, CEA, CA199, CHROMGRNA in the last 8760 hours.  Assessment and Plan:  23 y.o. female inpatient. History of heart murmer. Presented to the ED at [facility] on  7.25.25 with LLQ pain X 2 week. Found to have a left aTOA,  a right ovary lesion and significant anemia.. CT abd pelvis reads Complex left adnexal/ovarian lesions, favoring ovarian cystic lesions although possibly from hematosalpinx or pyosalpinx, characterized on prior ultrasound. There is inflammatory stranding especially around the left ovary/adnexa. If the patient has signs of infection then tubo-ovarian abscesses might be considered. Urine pregnancy test negative hence ectopic pregnancy is not a consideration. Referring to the prior ultrasound report, teratoma/dermoid is not likely given the lack of fat density on today's exam. A hemorrhagic or collapsed cyst is likewise a differential diagnostic consideration. Correlate with the patient's clinical scenario (pelvic exam, presence of fever/leukocytosis, and similar factors) to narrow the differential diagnosis. As noted on ultrasound, MRI may prove helpful in further characterization. 2.  Cystic lesion of the right ovary with internal density 19 Hounsfield units and somewhat thick margins, measuring 6.3 by 4.9 by 6.7 cm. Team is requesting left tubovarian aspiration possible drain placement.  PLAN: IR Image Guided Left Tubo- Ovarian Abscess Aspiration Possible Drain Placement  Risks and benefits discussed with the patient including bleeding, infection, damage to adjacent structures, bowel perforation/fistula connection, and sepsis.  All of the patient's questions were answered, patient is agreeable to proceed. Consent signed and in chart.   Thank you for this interesting consult.  I greatly enjoyed meeting Courtney Meadows and look forward to participating in their care.  A copy of this report was sent to the requesting provider on this date.  Electronically Signed: Delon JAYSON Beagle, NP 07/05/2024, 9:37 AM   I spent a total of 40 Minutes    in face to face in clinical consultation, greater than 50% of which was counseling/coordinating care for left tubo ovarian abscess drain placement possible aspiration

## 2024-07-05 NOTE — Procedures (Signed)
 Pre procedural Dx: Left tubo-ovarian abscess Post procedural Dx: Same  Technically successful CT guided aspiration of left tub-ovarian abscess yielding 15mL pus and blood.    A representative aspirated sample was capped and sent to the laboratory for analysis.    EBL: Trace Complications: None immediate  KANDICE Banner, MD Pager #: [phone]

## 2024-07-06 ENCOUNTER — Other Ambulatory Visit (HOSPITAL_COMMUNITY): Payer: Self-pay

## 2024-07-06 LAB — TYPE AND SCREEN
ABO/RH(D): O POS
Antibody Screen: NEGATIVE
Unit division: 0
Unit division: 0

## 2024-07-06 LAB — BPAM RBC
Blood Product Expiration Date: 202508202359
Blood Product Expiration Date: 202508212359
ISSUE DATE / TIME: 202507252100
Unit Type and Rh: 5100
Unit Type and Rh: 5100

## 2024-07-06 MED ORDER — METRONIDAZOLE 500 MG PO TABS: 500.0000 mg | ORAL_TABLET | Freq: Two times a day (BID) | ORAL | Status: DC

## 2024-07-06 MED ORDER — IBUPROFEN 600 MG PO TABS
600.0000 mg | ORAL_TABLET | Freq: Four times a day (QID) | ORAL | 0 refills | Status: AC | PRN
  Filled 2024-07-06: qty 30, 8d supply, fill #0

## 2024-07-06 MED ORDER — METRONIDAZOLE 500 MG PO TABS
500.0000 mg | ORAL_TABLET | Freq: Two times a day (BID) | ORAL | 0 refills | Status: AC
  Filled 2024-07-06: qty 28, 14d supply, fill #0

## 2024-07-06 MED ORDER — DOXYCYCLINE HYCLATE 100 MG PO TABS
100.0000 mg | ORAL_TABLET | Freq: Two times a day (BID) | ORAL | 0 refills | Status: AC
  Filled 2024-07-06: qty 28, 14d supply, fill #0

## 2024-07-06 NOTE — Discharge Summary (Signed)
 Physician Discharge Summary  Patient ID: Courtney Meadows MRN: [id number] DOB/AGE: 2001/10/14 23 y.o.  Admit date: 07/02/2024 Discharge date: 07/06/2024  Admission Diagnoses: Abdominal pain, TOA  Discharge Diagnoses:  Principal Problem:   TOA (tubo-ovarian abscess)   Discharged Condition: good  Hospital Course: Pt admitted with abdominal pain and hbg of 5.  CT scan showed bilateral ovarian cysts with inflammation in the left adnexa.  Pt was admitted for blood transfusion and IV antibiotics.  On HD #3, pt had CT guided drainage of abscess pocket.  No drain left in place.  Pt remained afebrile and did well.  GC/C cultures were found to be positive, but she already had adequate coverage.  Pt was discharged with continued oral abx for 14 days.  Abscess cultures showed WBC, but no organisms as of yet.  Pt advised she would need TOC in 6-8 weeks.  Consults: interventional radiology  Significant Diagnostic Studies: radiology: CT scan: right hemorrhagic cyst and left adnexal TOA/inflammation  Treatments: antibiotics: metronidazole , mefoxin  and doxycycline   Discharge Exam: Blood pressure (!) 132/92, pulse 63, temperature 98.9 F (37.2 C), temperature source Oral, resp. rate 17, height 5' 10 (1.778 m), weight 84.6 kg, last menstrual period 06/30/2024, SpO2 98%. General appearance: alert, cooperative, and no distress Head: Normocephalic, without obvious abnormality, atraumatic Resp: clear to auscultation bilaterally Cardio: regular rate and rhythm GI: soft, non-tender; bowel sounds normal; no masses,  no organomegaly Extremities: extremities normal, atraumatic, no cyanosis or edema and Homans sign is negative, no sign of DVT  Disposition: Discharge disposition: 01-Home or Self Care       Discharge Instructions     Call MD for:  difficulty breathing, headache or visual disturbances   Complete by: As directed    Call MD for:  persistant dizziness or light-headedness   Complete by: As  directed    Call MD for:  persistant nausea and vomiting   Complete by: As directed    Call MD for:  severe uncontrolled pain   Complete by: As directed    Call MD for:  temperature >100.4   Complete by: As directed    Diet general   Complete by: As directed    Increase activity slowly   Complete by: As directed    No wound care   Complete by: As directed       Allergies as of 07/06/2024   No Known Allergies      Medication List     STOP taking these medications    hydrocortisone  cream 1 %   promethazine -dextromethorphan 6.25-15 MG/5ML syrup Commonly known as: PROMETHAZINE -DM       TAKE these medications    doxycycline  100 MG tablet Commonly known as: VIBRA -TABS Take 1 tablet (100 mg total) by mouth 2 (two) times daily for 14 days.   ibuprofen  600 MG tablet Commonly known as: ADVIL  Take 1 tablet (600 mg total) by mouth every 6 (six) hours as needed (mild pain). What changed: reasons to take this   metroNIDAZOLE  500 MG tablet Commonly known as: FLAGYL  Take 1 tablet (500 mg total) by mouth every 12 (twelve) hours for 14 days.        Follow-up Information     [facility] for [facility]. Schedule an appointment as soon as possible for a visit in 3 week(s).   Specialty: Obstetrics and Gynecology Why: hospital f/u PID Contact information: [address] [place]  [address] [phone]  Signed: Jerilynn DELENA Buddle 07/06/2024, 12:01 PM

## 2024-07-06 NOTE — Plan of Care (Signed)

## 2024-07-06 NOTE — Progress Notes (Signed)
   07/06/24 1229  Departure Condition  Departure Condition Good  Mobility at [employer]  Patient/Caregiver Teaching Teach Back Method Used;Discharge instructions reviewed;Prescriptions reviewed;Follow-up care reviewed;Admission discussed;Pain management discussed;Medications discussed;Patient/caregiver verbalized understanding  Departure Mode With parents  Was procedural sedation performed on this patient during this visit? Yes   Pt alert and orientedx4, vital signs and pain stable at discharge.

## 2024-07-10 LAB — AEROBIC/ANAEROBIC CULTURE W GRAM STAIN (SURGICAL/DEEP WOUND): Culture: NO GROWTH

## 2024-07-12 ENCOUNTER — Ambulatory Visit: Payer: Self-pay | Admitting: Obstetrics and Gynecology

## 2024-07-28 ENCOUNTER — Ambulatory Visit (INDEPENDENT_AMBULATORY_CARE_PROVIDER_SITE_OTHER): Admitting: Obstetrics & Gynecology

## 2024-07-28 ENCOUNTER — Other Ambulatory Visit (HOSPITAL_COMMUNITY)
Admission: RE | Admit: 2024-07-28 | Discharge: 2024-07-28 | Disposition: A | Discharge status: Home / Self Care | Source: Ambulatory Visit | Attending: Obstetrics & Gynecology | Admitting: Obstetrics & Gynecology

## 2024-07-28 ENCOUNTER — Encounter: Payer: Self-pay | Admitting: Obstetrics & Gynecology

## 2024-07-28 VITALS — BP 122/86 | HR 70 | Ht 69.0 in | Wt 181.0 lb

## 2024-07-28 DIAGNOSIS — Z124 Encounter for screening for malignant neoplasm of cervix: Secondary | ICD-10-CM

## 2024-07-28 DIAGNOSIS — Z8742 Personal history of other diseases of the female genital tract: Secondary | ICD-10-CM

## 2024-07-28 DIAGNOSIS — Z3009 Encounter for other general counseling and advice on contraception: Secondary | ICD-10-CM

## 2024-07-28 DIAGNOSIS — Z09 Encounter for follow-up examination after completed treatment for conditions other than malignant neoplasm: Secondary | ICD-10-CM

## 2024-07-28 NOTE — Progress Notes (Signed)
   GYN VISIT Patient name: Courtney Meadows MRN [id number]  Date of birth: 11/21/2001 Chief Complaint:   Follow-up (Ed tubo ovarian cyst)  History of Present Illness:   Courtney Meadows is a 23 y.o. G0P0000 female being seen today for TOA follow up.  In review patient was hospitalized back at end of July due to [facility].  She is status post antibiotic course.  Today she denies pelvic or abdominal pain.  Denies regular discharge, itching or irritation.  She has not been sexually active since her hospitalization.  She does not desire pregnancy.  She plans to use condoms for contraception.       Patient's last menstrual period was 06/30/2024 (exact date).    Review of Systems:   Pertinent items are noted in HPI Denies fever/chills, dizziness, headaches, visual disturbances, fatigue, shortness of breath, chest pain, abdominal pain, vomiting, no problems with periods, bowel movements, urination, or intercourse unless otherwise stated above.  Pertinent History Reviewed:   Past Surgical History:  Procedure Laterality Date   KNEE SURGERY Left     Past Medical History:  Diagnosis Date   Heart murmur    Reviewed problem list, medications and allergies. Physical Assessment:   Vitals:   07/28/24 1340  BP: 122/86  Pulse: 70  Weight: 181 lb (82.1 kg)  Height: 5' 9 (1.753 m)  Body mass index is 26.73 kg/m.       Physical Examination:   General appearance: alert, well appearing, and in no distress  Psych: mood appropriate, normal affect  Skin: warm & dry   Cardiovascular: normal heart rate noted  Respiratory: normal respiratory effort, no distress  Abdomen: soft, non-tender   Pelvic: VULVA: normal appearing vulva with no masses, tenderness or lesions, VAGINA: normal appearing vagina with normal color and discharge, no lesions, CERVIX: normal appearing cervix without discharge or lesions, UTERUS: uterus is normal size, shape, consistency and nontender, ADNEXA: normal adnexa in size, nontender and  no masses  Extremities: no edema   Chaperone: Aleck Blase    Assessment & Plan:  1) h/o GC/C, PID - Plan for TOC today - Reviewed safe sex practices  2) Preventive screening - Pap collected, reviewed ASCCP guidelines  3) Contraceptive management -reviewed all contraceptive options including pills, patch, ring, Depot or LARCs -risk/benefit and potential side effects of each were reviewed, condoms for contraception   No orders of the defined types were placed in this encounter.   Return in about 1 year (around 07/28/2025) for Annual.   Aarav Burgett, DO Attending Obstetrician & Gynecologist, [facility] for [facility], [facility]

## 2024-08-03 LAB — CYTOLOGY - PAP
Chlamydia: NEGATIVE
Comment: NEGATIVE
Comment: NEGATIVE
Comment: NORMAL
Neisseria Gonorrhea: NEGATIVE
Trichomonas: NEGATIVE

## 2024-08-05 ENCOUNTER — Ambulatory Visit: Payer: Self-pay | Admitting: Obstetrics & Gynecology

## 2024-08-19 ENCOUNTER — Emergency Department
Admission: EM | Admit: 2024-08-19 | Discharge: 2024-08-19 | Discharge status: Against Medical Advice | Attending: Emergency Medicine | Admitting: Emergency Medicine

## 2024-08-19 ENCOUNTER — Other Ambulatory Visit: Payer: Self-pay

## 2024-08-19 DIAGNOSIS — R079 Chest pain, unspecified: Secondary | ICD-10-CM | POA: Insufficient documentation

## 2024-08-19 DIAGNOSIS — R109 Unspecified abdominal pain: Secondary | ICD-10-CM | POA: Diagnosis present

## 2024-08-19 DIAGNOSIS — Z5321 Procedure and treatment not carried out due to patient leaving prior to being seen by health care provider: Secondary | ICD-10-CM | POA: Diagnosis not present

## 2024-08-19 LAB — COMPREHENSIVE METABOLIC PANEL WITH GFR
ALT: 103 U/L — ABNORMAL HIGH (ref 0–44)
AST: 144 U/L — ABNORMAL HIGH (ref 15–41)
Albumin: 3.7 g/dL (ref 3.5–5.0)
Alkaline Phosphatase: 54 U/L (ref 38–126)
Anion gap: 10 (ref 5–15)
BUN: 10 mg/dL (ref 6–20)
CO2: 20 mmol/L — ABNORMAL LOW (ref 22–32)
Calcium: 8.8 mg/dL — ABNORMAL LOW (ref 8.9–10.3)
Chloride: 105 mmol/L (ref 98–111)
Creatinine, Ser: 0.7 mg/dL (ref 0.44–1.00)
GFR, Estimated: >60 mL/min (ref >60)
Glucose, Bld: 114 mg/dL — ABNORMAL HIGH (ref 70–99)
Potassium: 3.3 mmol/L — ABNORMAL LOW (ref 3.5–5.1)
Sodium: 135 mmol/L (ref 135–145)
Total Bilirubin: 1 mg/dL (ref 0.0–1.2)
Total Protein: 7.1 g/dL (ref 6.5–8.1)

## 2024-08-19 LAB — CBC
HCT: 30 % — ABNORMAL LOW (ref 36.0–46.0)
Hemoglobin: 9.4 g/dL — ABNORMAL LOW (ref 12.0–15.0)
MCH: 26 pg (ref 26.0–34.0)
MCHC: 31.3 g/dL (ref 30.0–36.0)
MCV: 82.9 fL (ref 80.0–100.0)
Platelets: 183 K/uL (ref 150–400)
RBC: 3.62 MIL/uL — ABNORMAL LOW (ref 3.87–5.11)
RDW: 20.2 % — ABNORMAL HIGH (ref 11.5–15.5)
WBC: 4.9 K/uL (ref 4.0–10.5)
nRBC: 0 % (ref 0.0–0.2)

## 2024-08-19 LAB — LIPASE, BLOOD: Lipase: 24 U/L (ref 11–51)

## 2024-08-19 NOTE — ED Triage Notes (Signed)
 Pt c/o abdominal pain with pressure in her chest. No n/v, this started today, no med hx, no med use

## 2024-11-17 ENCOUNTER — Ambulatory Visit
Admission: EM | Admit: 2024-11-17 | Discharge: 2024-11-17 | Disposition: A | Discharge status: Home / Self Care | Attending: Physician Assistant | Admitting: Physician Assistant

## 2024-11-17 DIAGNOSIS — R051 Acute cough: Secondary | ICD-10-CM

## 2024-11-17 DIAGNOSIS — J029 Acute pharyngitis, unspecified: Secondary | ICD-10-CM

## 2024-11-17 DIAGNOSIS — J101 Influenza due to other identified influenza virus with other respiratory manifestations: Secondary | ICD-10-CM | POA: Diagnosis not present

## 2024-11-17 DIAGNOSIS — R52 Pain, unspecified: Secondary | ICD-10-CM

## 2024-11-17 LAB — POCT RAPID STREP A (OFFICE): Rapid Strep A Screen: NEGATIVE

## 2024-11-17 LAB — POCT INFLUENZA A/B
Influenza A, POC: POSITIVE — AB
Influenza B, POC: NEGATIVE

## 2024-11-17 LAB — POC SOFIA SARS ANTIGEN FIA: SARS Coronavirus 2 Ag: NEGATIVE

## 2024-11-17 MED ORDER — OSELTAMIVIR PHOSPHATE 75 MG PO CAPS: 75.0000 mg | ORAL_CAPSULE | Freq: Two times a day (BID) | ORAL | 0 refills | Status: AC

## 2024-11-17 MED ORDER — PROMETHAZINE-DM 6.25-15 MG/5ML PO SYRP: 5.0000 mL | ORAL_SOLUTION | Freq: Four times a day (QID) | ORAL | 0 refills | Status: AC | PRN

## 2024-11-17 NOTE — ED Provider Notes (Signed)
 [facility]    CSN: [id number] Arrival date & time: 11/17/24  1223      History   Chief Complaint No chief complaint on file.   HPI Courtney Meadows is a 23 y.o. female presenting for 2 day history of fever, fatigue, cough, congestion, sore throat, headaches and body aches. Denies ear pain, chest pain,  abdominal pain, vomiting or diarrhea.  Patient has been taking over-the-counter meds. No other complaints.  HPI  Past Medical History:  Diagnosis Date   Heart murmur     Patient Active Problem List   Diagnosis Date Noted   TOA (tubo-ovarian abscess) 07/02/2024   Body mass index (BMI) pediatric, 95th percentile for age to less than 120% of the 95th percentile for age 56/19/2025   Closed avulsion fracture of lateral malleolus of right fibula 02/25/2024   Overweight 02/25/2024   Sprain of left knee 02/25/2024   Well adolescent visit 02/25/2024   Tear of articular cartilage of left knee as current injury 09/07/2020   Antalgic gait 05/23/2020    Past Surgical History:  Procedure Laterality Date   KNEE SURGERY Left     OB History     Gravida  0   Para  0   Term  0   Preterm  0   AB  0   Living  0      SAB  0   IAB  0   Ectopic  0   Multiple  0   Live Births  0            Home Medications    Prior to Admission medications   Medication Sig Start Date End Date Taking? Authorizing Provider  oseltamivir (TAMIFLU) 75 MG capsule Take 1 capsule (75 mg total) by mouth every 12 (twelve) hours for 5 days. 11/17/24 11/22/24 Yes Arvis Huxley B, PA-C  promethazine -dextromethorphan (PROMETHAZINE -DM) 6.25-15 MG/5ML syrup Take 5 mLs by mouth 4 (four) times daily as needed. 11/17/24  Yes Arvis Huxley NOVAK, PA-C  ibuprofen  (ADVIL ) 600 MG tablet Take 1 tablet (600 mg total) by mouth every 6 (six) hours as needed (mild pain). 07/06/24   Zina Jerilynn LABOR, MD    Family History Family History  Problem Relation Age of Onset   Heart attack Father     Social  History Social History   Tobacco Use   Smoking status: Never   Smokeless tobacco: Never  Vaping Use   Vaping status: Never Used  Substance Use Topics   Alcohol use: Yes    Comment: occ   Drug use: No     Allergies   Patient has no known allergies.   Review of Systems Review of Systems  Constitutional:  Positive for fatigue and fever. Negative for chills and diaphoresis.  HENT:  Positive for congestion, rhinorrhea and sore throat. Negative for ear pain, sinus pressure and sinus pain.   Respiratory:  Positive for cough. Negative for shortness of breath and wheezing.   Cardiovascular:  Negative for chest pain.  Gastrointestinal:  Negative for abdominal pain, nausea and vomiting.  Musculoskeletal:  Positive for myalgias.  Skin:  Negative for rash.  Neurological:  Positive for headaches. Negative for weakness.  Hematological:  Negative for adenopathy.     Physical Exam Triage Vital Signs  No data found.  Updated Vital Signs BP (!) 146/89 (BP Location: Right Arm)   Pulse 94   Temp 99.3 F (37.4 C) (Oral)   Resp 18   Wt 178 lb (80.7 kg)  SpO2 97%   BMI 26.29 kg/m      Physical Exam Vitals and nursing note reviewed.  Constitutional:      General: She is not in acute distress.    Appearance: Normal appearance. She is not ill-appearing or toxic-appearing.  HENT:     Head: Normocephalic and atraumatic.     Nose: Congestion present.     Mouth/Throat:     Mouth: Mucous membranes are moist.     Pharynx: Oropharynx is clear. Posterior oropharyngeal erythema present.  Eyes:     General: No scleral icterus.       Right eye: No discharge.        Left eye: No discharge.     Conjunctiva/sclera: Conjunctivae normal.  Cardiovascular:     Rate and Rhythm: Normal rate and regular rhythm.     Heart sounds: Normal heart sounds.  Pulmonary:     Effort: Pulmonary effort is normal. No respiratory distress.     Breath sounds: Normal breath sounds.  Musculoskeletal:      Cervical back: Neck supple.  Skin:    General: Skin is dry.  Neurological:     General: No focal deficit present.     Mental Status: She is alert. Mental status is at baseline.     Motor: No weakness.     Gait: Gait normal.  Psychiatric:        Mood and Affect: Mood normal.        Behavior: Behavior normal.      UC Treatments / Results  Labs (all labs ordered are listed, but only abnormal results are displayed) Labs Reviewed  POCT INFLUENZA A/B - Abnormal; Notable for the following components:      Result Value   Influenza A, POC Positive (*)    All other components within normal limits  POC SOFIA SARS ANTIGEN FIA - Normal  POCT RAPID STREP A (OFFICE) - Normal    EKG   Radiology No results found.   Procedures Procedures (including critical care time)  Medications Ordered in UC Medications - No data to display  Initial Impression / Assessment and Plan / UC Course  I have reviewed the triage vital signs and the nursing notes.  Pertinent labs & imaging results that were available during my care of the patient were reviewed by me and considered in my medical decision making (see chart for details).   23 year old female presents with mother for cough, congestion, fatigue, fever, sore throat and body aches x 2 days.  Vitals are stable and normal.  Ill appearing. Non toxic. No acute distress.  On exam has nasal congestion.  Throat with mild erythema. Chest clear.  Heart regular rate and rhythm.  Rapid strep negative COVID flu obtained. Positive flu A.   Results with patient.  Sent Tamiflu to pharmacy.  Supportive care encouraged.  Increase rest and fluids. Sent Promethazine  DM to pharmacy.  Reviewed current CDC guidelines, isolation protocol and ED precautions for flu.  Work note given.  Acute illness with systemic symptoms.  Final Clinical Impressions(s) / UC Diagnoses   Final diagnoses:  Influenza A  Acute cough  Body aches  Sore throat     Discharge  Instructions      - Flu is positive.  You are within the window for treatment Tamiflu to potentially be helpful so I sent it to the pharmacy if you are positive. - Sent cough medicine and Tamiflu. - You need to isolate until you are fever free for 24  hours and symptoms are improving. - Increase rest and fluids. - You should be seen again if you have uncontrolled fever, weakness or worsening breathing problem.       ED Prescriptions     Medication Sig Dispense Auth. Provider   oseltamivir (TAMIFLU) 75 MG capsule Take 1 capsule (75 mg total) by mouth every 12 (twelve) hours for 5 days. 10 capsule Arvis Huxley B, PA-C   promethazine -dextromethorphan (PROMETHAZINE -DM) 6.25-15 MG/5ML syrup Take 5 mLs by mouth 4 (four) times daily as needed. 118 mL Arvis Huxley NOVAK, PA-C      PDMP not reviewed this encounter.       Arvis Huxley NOVAK, PA-C 11/17/24 1327

## 2024-11-17 NOTE — Discharge Instructions (Addendum)
-   Flu is positive.  You are within the window for treatment Tamiflu to potentially be helpful so I sent it to the pharmacy if you are positive. - Sent cough medicine and Tamiflu. - You need to isolate until you are fever free for 24 hours and symptoms are improving. - Increase rest and fluids. - You should be seen again if you have uncontrolled fever, weakness or worsening breathing problem.

## 2024-11-17 NOTE — ED Triage Notes (Addendum)
 Sx x 2 days  body aches headache sneezing coughing fever sinus and chest congestion,sore throat  no appetite x 2 days
